# Patient Record
Sex: Female | Born: 1945 | Race: White | Hispanic: No | Marital: Married | Smoking: Never smoker
Health system: Southern US, Community
[De-identification: ages and names within clinical notes are randomized; demographics above are authoritative.]

## PROBLEM LIST (undated history)

## (undated) DIAGNOSIS — I10 Essential (primary) hypertension: Secondary | ICD-10-CM

## (undated) HISTORY — PX: BLADDER SURGERY: SHX569

## (undated) HISTORY — PX: CHOLECYSTECTOMY: SHX55

## (undated) HISTORY — PX: ABDOMINAL HYSTERECTOMY: SHX81

---

## 2014-03-07 ENCOUNTER — Encounter (HOSPITAL_COMMUNITY): Payer: Self-pay | Admitting: Emergency Medicine

## 2014-03-07 ENCOUNTER — Inpatient Hospital Stay (HOSPITAL_COMMUNITY)
Admission: EM | Admit: 2014-03-07 | Discharge: 2014-03-11 | DRG: 482 | Disposition: A | Discharge status: Home Health | Payer: Medicare Other | Attending: Internal Medicine | Admitting: Internal Medicine

## 2014-03-07 DIAGNOSIS — S72001A Fracture of unspecified part of neck of right femur, initial encounter for closed fracture: Secondary | ICD-10-CM

## 2014-03-07 DIAGNOSIS — Z7982 Long term (current) use of aspirin: Secondary | ICD-10-CM

## 2014-03-07 DIAGNOSIS — W108XXA Fall (on) (from) other stairs and steps, initial encounter: Secondary | ICD-10-CM | POA: Diagnosis present

## 2014-03-07 DIAGNOSIS — M810 Age-related osteoporosis without current pathological fracture: Secondary | ICD-10-CM | POA: Diagnosis present

## 2014-03-07 DIAGNOSIS — Z79899 Other long term (current) drug therapy: Secondary | ICD-10-CM

## 2014-03-07 DIAGNOSIS — E876 Hypokalemia: Secondary | ICD-10-CM

## 2014-03-07 DIAGNOSIS — I10 Essential (primary) hypertension: Secondary | ICD-10-CM

## 2014-03-07 DIAGNOSIS — S72033A Displaced midcervical fracture of unspecified femur, initial encounter for closed fracture: Principal | ICD-10-CM | POA: Diagnosis present

## 2014-03-07 DIAGNOSIS — S01111A Laceration without foreign body of right eyelid and periocular area, initial encounter: Secondary | ICD-10-CM

## 2014-03-07 DIAGNOSIS — S0180XA Unspecified open wound of other part of head, initial encounter: Secondary | ICD-10-CM | POA: Diagnosis present

## 2014-03-07 HISTORY — DX: Essential (primary) hypertension: I10

## 2014-03-07 LAB — CBC WITH DIFFERENTIAL/PLATELET
Basophils Absolute: 0 10*3/uL (ref 0.0–0.1)
Basophils Relative: 0 % (ref 0–1)
EOS ABS: 0.2 10*3/uL (ref 0.0–0.7)
Eosinophils Relative: 1 % (ref 0–5)
HCT: 33.5 % — ABNORMAL LOW (ref 36.0–46.0)
Hemoglobin: 10.9 g/dL — ABNORMAL LOW (ref 12.0–15.0)
LYMPHS ABS: 2.3 10*3/uL (ref 0.7–4.0)
LYMPHS PCT: 19 % (ref 12–46)
MCH: 26.8 pg (ref 26.0–34.0)
MCHC: 32.5 g/dL (ref 30.0–36.0)
MCV: 82.3 fL (ref 78.0–100.0)
Monocytes Absolute: 0.8 10*3/uL (ref 0.1–1.0)
Monocytes Relative: 6 % (ref 3–12)
NEUTROS ABS: 8.8 10*3/uL — AB (ref 1.7–7.7)
Neutrophils Relative %: 74 % (ref 43–77)
PLATELETS: 221 10*3/uL (ref 150–400)
RBC: 4.07 MIL/uL (ref 3.87–5.11)
RDW: 13.2 % (ref 11.5–15.5)
WBC: 12 10*3/uL — AB (ref 4.0–10.5)

## 2014-03-07 MED ORDER — FENTANYL CITRATE 0.05 MG/ML IJ SOLN
50.0000 ug | Freq: Once | INTRAMUSCULAR | Status: AC
  Administered 2014-03-07: 50 ug via INTRAVENOUS
  Filled 2014-03-07: qty 2

## 2014-03-07 MED ORDER — ONDANSETRON HCL 4 MG/2ML IJ SOLN
4.0000 mg | Freq: Once | INTRAMUSCULAR | Status: AC
  Administered 2014-03-07: 4 mg via INTRAVENOUS
  Filled 2014-03-07: qty 2

## 2014-03-07 NOTE — ED Provider Notes (Signed)
CSN: [id number]     Arrival date & time 03/07/14  2313 History   First MD Initiated Contact with Patient 03/07/14 2331     Chief Complaint  Patient presents with  . Fall     (Consider location/radiation/quality/duration/timing/severity/associated sxs/prior Treatment) HPI Patient states she was walking down wooden steps in socks and slipped. She fell down 3 or 4 steps. She landed on her right hip. She also struck her head but denies any loss of consciousness. She was unable to ambulate after the fall. She complains of scalp pain and right hip pain. Denies any other pain. EMS arrived and place patient on long board and cervical collar precautions. Patient denies any focal weakness or numbness. Past Medical History  Diagnosis Date  . Hypertension    Past Surgical History  Procedure Laterality Date  . Cholecystectomy    . Bladder surgery    . Abdominal hysterectomy     No family history on file. History  Substance Use Topics  . Smoking status: Never Smoker   . Smokeless tobacco: Not on file  . Alcohol Use: No   OB History   Grav Para Term Preterm Abortions TAB SAB Ect Mult Living                 Review of Systems  Constitutional: Negative for fever and chills.  Respiratory: Negative for shortness of breath.   Cardiovascular: Negative for chest pain.  Gastrointestinal: Negative for nausea, vomiting, abdominal pain and diarrhea.  Musculoskeletal: Positive for arthralgias. Negative for back pain, neck pain and neck stiffness.  Skin: Positive for wound.  Neurological: Positive for headaches. Negative for dizziness, syncope, weakness, light-headedness and numbness.  All other systems reviewed and are negative.     Allergies  Sulfa antibiotics  Home Medications   Prior to Admission medications   Not on File   BP 131/76  Pulse 69  Temp(Src) 97.9 F (36.6 C) (Oral)  Resp 22  SpO2 94% Physical Exam  Nursing note and vitals reviewed. Constitutional: She is oriented to  person, place, and time. She appears well-developed and well-nourished. No distress.  HENT:  Head: Normocephalic.  Mouth/Throat: Oropharynx is clear and moist.  2 cm laceration above the right eyebrow. No active bleeding.  Eyes: EOM are normal. Pupils are equal, round, and reactive to light.  Neck:  Cervical collar in place  Cardiovascular: Normal rate and regular rhythm.   Pulmonary/Chest: Effort normal and breath sounds normal. No respiratory distress. She has no wheezes. She has no rales. She exhibits no tenderness.  Abdominal: Soft. Bowel sounds are normal. She exhibits no distension and no mass. There is no tenderness. There is no rebound and no guarding.  Musculoskeletal: She exhibits no edema and no tenderness.  Decreased range of motion of the right hip due to pain. No tenderness to palpation of the right knee and right ankle. Dorsalis pedis pulses intact. Question external rotation of the right lower extremity.  Neurological: She is alert and oriented to person, place, and time.  No motor deficit noted. Examination of the right lower extremity is limited by pain. Sensation is fully intact.  Skin: Skin is warm and dry. No rash noted. No erythema.  Psychiatric: She has a normal mood and affect. Her behavior is normal.    ED Course  LACERATION REPAIR Date/Time: 03/08/2014 2:55 AM Performed by: Loren Racer Authorized by: Ranae Palms, Ledger Heindl Body area: head/neck Location details: right eyebrow Laceration length: 1 cm Foreign bodies: no foreign bodies Tendon involvement: none  Nerve involvement: none Irrigation solution: saline Amount of cleaning: standard Skin closure: glue Patient tolerance: Patient tolerated the procedure well with no immediate complications.   (including critical care time) Labs Review Labs Reviewed  CBC WITH DIFFERENTIAL - Abnormal; Notable for the following:    WBC 12.0 (*)    Hemoglobin 10.9 (*)    HCT 33.5 (*)    Neutro Abs 8.8 (*)    All other  components within normal limits  COMPREHENSIVE METABOLIC PANEL  PROTIME-INR  URINALYSIS, ROUTINE W REFLEX MICROSCOPIC    Imaging Review No results found.   EKG Interpretation None      MDM   Final diagnoses:  None     Discussed with Dr. Lanae BoastGarner who will admit the patient. Dr. Eulah PontMurphy is aware of the patient and will see her this morning.  Loren Raceravid Courvoisier Hamblen, MD 03/08/14 [phone]

## 2014-03-07 NOTE — ED Notes (Signed)
Pt arrives via EMS from home, fell in shower, tripped and fell and hit head. No LOC. Allergic to sulfa. Refuses pain meds d/t N/V.

## 2014-03-07 NOTE — ED Notes (Signed)
The patient's husband took a Ruby stone with a God ring off the right hand and Wedding set (1/2 carat diamond) off of the left hand. The tech has reported to the RN in charge.

## 2014-03-07 NOTE — ED Notes (Signed)
Right hip fx, externally rotated, good pedial pulses

## 2014-03-08 ENCOUNTER — Encounter (HOSPITAL_COMMUNITY): Payer: Medicare Other | Admitting: Anesthesiology

## 2014-03-08 ENCOUNTER — Emergency Department (HOSPITAL_COMMUNITY): Payer: Medicare Other

## 2014-03-08 ENCOUNTER — Inpatient Hospital Stay (HOSPITAL_COMMUNITY): Payer: Medicare Other

## 2014-03-08 ENCOUNTER — Encounter (HOSPITAL_COMMUNITY)
Admission: EM | Disposition: A | Discharge status: Home Health | Payer: Medicare Other | Source: Home / Self Care | Attending: Internal Medicine

## 2014-03-08 ENCOUNTER — Encounter (HOSPITAL_COMMUNITY): Payer: Self-pay | Admitting: Emergency Medicine

## 2014-03-08 ENCOUNTER — Encounter (HOSPITAL_COMMUNITY)
Admission: EM | Disposition: A | Discharge status: Home Health | Payer: Self-pay | Source: Home / Self Care | Attending: Internal Medicine

## 2014-03-08 ENCOUNTER — Inpatient Hospital Stay (HOSPITAL_COMMUNITY): Payer: Medicare Other | Admitting: Anesthesiology

## 2014-03-08 DIAGNOSIS — S72001A Fracture of unspecified part of neck of right femur, initial encounter for closed fracture: Secondary | ICD-10-CM | POA: Diagnosis present

## 2014-03-08 DIAGNOSIS — S0180XA Unspecified open wound of other part of head, initial encounter: Secondary | ICD-10-CM

## 2014-03-08 DIAGNOSIS — S72009A Fracture of unspecified part of neck of unspecified femur, initial encounter for closed fracture: Secondary | ICD-10-CM

## 2014-03-08 DIAGNOSIS — E876 Hypokalemia: Secondary | ICD-10-CM | POA: Diagnosis present

## 2014-03-08 DIAGNOSIS — I10 Essential (primary) hypertension: Secondary | ICD-10-CM

## 2014-03-08 HISTORY — PX: HIP PINNING,CANNULATED: SHX1758

## 2014-03-08 LAB — BASIC METABOLIC PANEL
BUN: 13 mg/dL (ref 6–23)
CO2: 26 mEq/L (ref 19–32)
CREATININE: 0.58 mg/dL (ref 0.50–1.10)
Calcium: 8.8 mg/dL (ref 8.4–10.5)
Chloride: 99 mEq/L (ref 96–112)
GFR calc non Af Amer: >90 mL/min (ref >90)
Glucose, Bld: 106 mg/dL — ABNORMAL HIGH (ref 70–99)
POTASSIUM: 3.2 meq/L — AB (ref 3.7–5.3)
Sodium: 139 mEq/L (ref 137–147)

## 2014-03-08 LAB — COMPREHENSIVE METABOLIC PANEL
ALT: 13 U/L (ref 0–35)
AST: 19 U/L (ref 0–37)
Albumin: 3.8 g/dL (ref 3.5–5.2)
Alkaline Phosphatase: 59 U/L (ref 39–117)
BUN: 18 mg/dL (ref 6–23)
CO2: 27 meq/L (ref 19–32)
Calcium: 9.2 mg/dL (ref 8.4–10.5)
Chloride: 86 mEq/L — ABNORMAL LOW (ref 96–112)
Creatinine, Ser: 0.72 mg/dL (ref 0.50–1.10)
GFR calc non Af Amer: 86 mL/min — ABNORMAL LOW (ref >90)
Glucose, Bld: 118 mg/dL — ABNORMAL HIGH (ref 70–99)
POTASSIUM: 2.8 meq/L — AB (ref 3.7–5.3)
Sodium: 127 mEq/L — ABNORMAL LOW (ref 137–147)
TOTAL PROTEIN: 7.3 g/dL (ref 6.0–8.3)
Total Bilirubin: 0.3 mg/dL (ref 0.3–1.2)

## 2014-03-08 LAB — URINALYSIS, ROUTINE W REFLEX MICROSCOPIC
Bilirubin Urine: NEGATIVE
GLUCOSE, UA: NEGATIVE mg/dL
Hgb urine dipstick: NEGATIVE
Ketones, ur: 15 mg/dL — AB
Leukocytes, UA: NEGATIVE
Nitrite: NEGATIVE
PROTEIN: NEGATIVE mg/dL
Specific Gravity, Urine: 1.022 (ref 1.005–1.030)
Urobilinogen, UA: 1 mg/dL (ref 0.0–1.0)
pH: 7 (ref 5.0–8.0)

## 2014-03-08 LAB — SURGICAL PCR SCREEN
MRSA, PCR: NEGATIVE
Staphylococcus aureus: NEGATIVE

## 2014-03-08 LAB — PROTIME-INR
INR: 1.04 (ref 0.00–1.49)
Prothrombin Time: 13.4 seconds (ref 11.6–15.2)

## 2014-03-08 SURGERY — FIXATION, FEMUR, NECK, PERCUTANEOUS, USING SCREW
Anesthesia: General | Site: Hip | Laterality: Right

## 2014-03-08 SURGERY — PINNING, EXTREMITY, PERCUTANEOUS
Anesthesia: General | Laterality: Right

## 2014-03-08 MED ORDER — OXYCODONE HCL 5 MG/5ML PO SOLN: 5.0000 mg | Freq: Once | ORAL | Status: AC | PRN

## 2014-03-08 MED ORDER — FENTANYL CITRATE 0.05 MG/ML IJ SOLN
INTRAMUSCULAR | Status: DC | PRN
  Administered 2014-03-08: 50 ug via INTRAVENOUS

## 2014-03-08 MED ORDER — ONDANSETRON HCL 4 MG/2ML IJ SOLN: 4.0000 mg | Freq: Four times a day (QID) | INTRAMUSCULAR | Status: DC | PRN

## 2014-03-08 MED ORDER — LIDOCAINE HCL (CARDIAC) 20 MG/ML IV SOLN
INTRAVENOUS | Status: DC | PRN
  Administered 2014-03-08: 100 mg via INTRAVENOUS

## 2014-03-08 MED ORDER — CEFAZOLIN SODIUM-DEXTROSE 2-3 GM-% IV SOLR
INTRAVENOUS | Status: DC | PRN
  Administered 2014-03-08: 2 g via INTRAVENOUS

## 2014-03-08 MED ORDER — GLYCOPYRROLATE 0.2 MG/ML IJ SOLN
INTRAMUSCULAR | Status: DC | PRN
  Administered 2014-03-08: 0.4 mg via INTRAVENOUS

## 2014-03-08 MED ORDER — DOCUSATE SODIUM 100 MG PO CAPS: 100.0000 mg | ORAL_CAPSULE | Freq: Two times a day (BID) | ORAL | Status: AC

## 2014-03-08 MED ORDER — LIDOCAINE HCL (CARDIAC) 20 MG/ML IV SOLN
INTRAVENOUS | Status: AC
  Filled 2014-03-08: qty 5

## 2014-03-08 MED ORDER — ROCURONIUM BROMIDE 50 MG/5ML IV SOLN
INTRAVENOUS | Status: AC
  Filled 2014-03-08: qty 1

## 2014-03-08 MED ORDER — NEOSTIGMINE METHYLSULFATE 10 MG/10ML IV SOLN
INTRAVENOUS | Status: DC | PRN
  Administered 2014-03-08: 3 mg via INTRAVENOUS

## 2014-03-08 MED ORDER — ASPIRIN EC 325 MG PO TBEC
325.0000 mg | DELAYED_RELEASE_TABLET | Freq: Every day | ORAL | Status: DC
  Administered 2014-03-09 – 2014-03-11 (×3): 325 mg via ORAL
  Filled 2014-03-08 (×4): qty 1

## 2014-03-08 MED ORDER — FENTANYL CITRATE 0.05 MG/ML IJ SOLN
50.0000 ug | Freq: Once | INTRAMUSCULAR | Status: AC
  Administered 2014-03-08: 50 ug via INTRAVENOUS
  Filled 2014-03-08: qty 2

## 2014-03-08 MED ORDER — EPHEDRINE SULFATE 50 MG/ML IJ SOLN
INTRAMUSCULAR | Status: DC | PRN
  Administered 2014-03-08: 15 mg via INTRAVENOUS
  Administered 2014-03-08: 10 mg via INTRAVENOUS

## 2014-03-08 MED ORDER — HYDROMORPHONE HCL PF 1 MG/ML IJ SOLN
0.2500 mg | INTRAMUSCULAR | Status: DC | PRN
  Administered 2014-03-08 (×4): 0.5 mg via INTRAVENOUS

## 2014-03-08 MED ORDER — BUPIVACAINE HCL (PF) 0.25 % IJ SOLN
INTRAMUSCULAR | Status: AC
  Filled 2014-03-08: qty 30

## 2014-03-08 MED ORDER — ONDANSETRON HCL 4 MG PO TABS: 4.0000 mg | ORAL_TABLET | Freq: Four times a day (QID) | ORAL | Status: DC | PRN

## 2014-03-08 MED ORDER — PROPOFOL 10 MG/ML IV BOLUS
INTRAVENOUS | Status: AC
  Filled 2014-03-08: qty 20

## 2014-03-08 MED ORDER — ASPIRIN EC 325 MG PO TBEC: 325.0000 mg | DELAYED_RELEASE_TABLET | Freq: Every day | ORAL | Status: AC

## 2014-03-08 MED ORDER — PHENOL 1.4 % MT LIQD
1.0000 | OROMUCOSAL | Status: DC | PRN
  Filled 2014-03-08: qty 177

## 2014-03-08 MED ORDER — CEFAZOLIN SODIUM-DEXTROSE 2-3 GM-% IV SOLR
2.0000 g | Freq: Four times a day (QID) | INTRAVENOUS | Status: AC
  Administered 2014-03-09 (×2): 2 g via INTRAVENOUS
  Filled 2014-03-08 (×2): qty 50

## 2014-03-08 MED ORDER — ACETAMINOPHEN 325 MG PO TABS: 650.0000 mg | ORAL_TABLET | Freq: Four times a day (QID) | ORAL | Status: DC | PRN

## 2014-03-08 MED ORDER — ONDANSETRON HCL 4 MG PO TABS: 4.0000 mg | ORAL_TABLET | Freq: Three times a day (TID) | ORAL | Status: AC | PRN

## 2014-03-08 MED ORDER — LACTATED RINGERS IV SOLN
INTRAVENOUS | Status: DC
  Administered 2014-03-08: 17:00:00 via INTRAVENOUS

## 2014-03-08 MED ORDER — MENTHOL 3 MG MT LOZG
1.0000 | LOZENGE | OROMUCOSAL | Status: DC | PRN
  Filled 2014-03-08: qty 9

## 2014-03-08 MED ORDER — MORPHINE SULFATE 2 MG/ML IJ SOLN
0.5000 mg | INTRAMUSCULAR | Status: DC | PRN
  Administered 2014-03-08 (×3): 0.5 mg via INTRAVENOUS
  Filled 2014-03-08 (×3): qty 1

## 2014-03-08 MED ORDER — POTASSIUM CHLORIDE 10 MEQ/100ML IV SOLN
10.0000 meq | INTRAVENOUS | Status: AC
  Administered 2014-03-08 (×2): 10 meq via INTRAVENOUS
  Filled 2014-03-08 (×2): qty 100

## 2014-03-08 MED ORDER — HYDROCODONE-ACETAMINOPHEN 5-325 MG PO TABS: 1.0000 | ORAL_TABLET | ORAL | Status: AC | PRN

## 2014-03-08 MED ORDER — ROCURONIUM BROMIDE 100 MG/10ML IV SOLN
INTRAVENOUS | Status: DC | PRN
  Administered 2014-03-08: 40 mg via INTRAVENOUS

## 2014-03-08 MED ORDER — HYDROMORPHONE HCL PF 1 MG/ML IJ SOLN
INTRAMUSCULAR | Status: AC
  Filled 2014-03-08: qty 1

## 2014-03-08 MED ORDER — LACTATED RINGERS IV SOLN
INTRAVENOUS | Status: DC | PRN
  Administered 2014-03-08: 18:00:00 via INTRAVENOUS

## 2014-03-08 MED ORDER — MIDAZOLAM HCL 5 MG/5ML IJ SOLN
INTRAMUSCULAR | Status: DC | PRN
  Administered 2014-03-08: 2 mg via INTRAVENOUS

## 2014-03-08 MED ORDER — FENTANYL CITRATE 0.05 MG/ML IJ SOLN
INTRAMUSCULAR | Status: AC
  Filled 2014-03-08: qty 5

## 2014-03-08 MED ORDER — PROPRANOLOL HCL ER 80 MG PO CP24
80.0000 mg | ORAL_CAPSULE | Freq: Every day | ORAL | Status: DC
  Administered 2014-03-09 – 2014-03-11 (×3): 80 mg via ORAL
  Filled 2014-03-08 (×4): qty 1

## 2014-03-08 MED ORDER — ASPIRIN EC 325 MG PO TBEC
325.0000 mg | DELAYED_RELEASE_TABLET | Freq: Every day | ORAL | Status: DC
  Filled 2014-03-08: qty 1

## 2014-03-08 MED ORDER — NEOSTIGMINE METHYLSULFATE 10 MG/10ML IV SOLN
INTRAVENOUS | Status: AC
  Filled 2014-03-08: qty 1

## 2014-03-08 MED ORDER — ACETAMINOPHEN 650 MG RE SUPP: 650.0000 mg | Freq: Four times a day (QID) | RECTAL | Status: DC | PRN

## 2014-03-08 MED ORDER — OXYCODONE HCL 5 MG PO TABS
ORAL_TABLET | ORAL | Status: AC
  Filled 2014-03-08: qty 1

## 2014-03-08 MED ORDER — ONDANSETRON HCL 4 MG/2ML IJ SOLN
INTRAMUSCULAR | Status: DC | PRN
  Administered 2014-03-08: 4 mg via INTRAVENOUS

## 2014-03-08 MED ORDER — BUPIVACAINE HCL (PF) 0.25 % IJ SOLN
INTRAMUSCULAR | Status: DC | PRN
  Administered 2014-03-08: 10 mL

## 2014-03-08 MED ORDER — GLYCOPYRROLATE 0.2 MG/ML IJ SOLN
INTRAMUSCULAR | Status: AC
  Filled 2014-03-08: qty 2

## 2014-03-08 MED ORDER — PROPOFOL 10 MG/ML IV BOLUS
INTRAVENOUS | Status: DC | PRN
  Administered 2014-03-08: 150 mg via INTRAVENOUS

## 2014-03-08 MED ORDER — MIDAZOLAM HCL 2 MG/2ML IJ SOLN
INTRAMUSCULAR | Status: AC
  Filled 2014-03-08: qty 2

## 2014-03-08 MED ORDER — ONDANSETRON HCL 4 MG/2ML IJ SOLN
4.0000 mg | Freq: Once | INTRAMUSCULAR | Status: DC | PRN
Start: 2014-03-08 — End: 2014-03-08

## 2014-03-08 MED ORDER — METOCLOPRAMIDE HCL 5 MG/ML IJ SOLN: 5.0000 mg | Freq: Three times a day (TID) | INTRAMUSCULAR | Status: DC | PRN

## 2014-03-08 MED ORDER — ONDANSETRON HCL 4 MG/2ML IJ SOLN
4.0000 mg | Freq: Four times a day (QID) | INTRAMUSCULAR | Status: DC | PRN
  Administered 2014-03-08: 4 mg via INTRAVENOUS
  Filled 2014-03-08: qty 2

## 2014-03-08 MED ORDER — OXYCODONE HCL 5 MG PO TABS
5.0000 mg | ORAL_TABLET | Freq: Once | ORAL | Status: AC | PRN
  Administered 2014-03-08: 5 mg via ORAL

## 2014-03-08 MED ORDER — METOCLOPRAMIDE HCL 5 MG PO TABS: 5.0000 mg | ORAL_TABLET | Freq: Three times a day (TID) | ORAL | Status: DC | PRN

## 2014-03-08 MED ORDER — PHENYLEPHRINE HCL 10 MG/ML IJ SOLN
INTRAMUSCULAR | Status: DC | PRN
  Administered 2014-03-08: 80 ug via INTRAVENOUS

## 2014-03-08 MED ORDER — SODIUM CHLORIDE 0.9 % IV SOLN
Freq: Once | INTRAVENOUS | Status: AC
  Administered 2014-03-08: 01:00:00 via INTRAVENOUS

## 2014-03-08 MED ORDER — HYDROCODONE-ACETAMINOPHEN 5-325 MG PO TABS
1.0000 | ORAL_TABLET | Freq: Four times a day (QID) | ORAL | Status: DC | PRN
  Administered 2014-03-08 – 2014-03-10 (×5): 2 via ORAL
  Administered 2014-03-10: 1 via ORAL
  Administered 2014-03-10 – 2014-03-11 (×3): 2 via ORAL
  Filled 2014-03-08 (×9): qty 2

## 2014-03-08 SURGICAL SUPPLY — 30 items
COVER PERINEAL POST (MISCELLANEOUS) ×3 IMPLANT
COVER SURGICAL LIGHT HANDLE (MISCELLANEOUS) ×3 IMPLANT
DRAPE IMP U-DRAPE 54X76 (DRAPES) ×3 IMPLANT
DRAPE STERI IOBAN 125X83 (DRAPES) ×3 IMPLANT
DRSG EMULSION OIL 3X3 NADH (GAUZE/BANDAGES/DRESSINGS) ×3 IMPLANT
DRSG TEGADERM 4X4.75 (GAUZE/BANDAGES/DRESSINGS) ×3 IMPLANT
DURAPREP 26ML APPLICATOR (WOUND CARE) ×3 IMPLANT
ELECT REM PT RETURN 9FT ADLT (ELECTROSURGICAL) ×3
ELECTRODE REM PT RTRN 9FT ADLT (ELECTROSURGICAL) ×1 IMPLANT
GLOVE BIO SURGEON STRL SZ7.5 (GLOVE) ×6 IMPLANT
GLOVE BIOGEL PI IND STRL 8 (GLOVE) ×1 IMPLANT
GLOVE BIOGEL PI INDICATOR 8 (GLOVE) ×2
GOWN STRL REUS W/ TWL LRG LVL3 (GOWN DISPOSABLE) ×1 IMPLANT
GOWN STRL REUS W/TWL LRG LVL3 (GOWN DISPOSABLE) ×2
KIT BASIN OR (CUSTOM PROCEDURE TRAY) ×3 IMPLANT
KIT ROOM TURNOVER OR (KITS) ×3 IMPLANT
LINER BOOT UNIVERSAL DISP (MISCELLANEOUS) ×3 IMPLANT
MANIFOLD NEPTUNE II (INSTRUMENTS) ×3 IMPLANT
NS IRRIG 1000ML POUR BTL (IV SOLUTION) ×3 IMPLANT
PACK GENERAL/GYN (CUSTOM PROCEDURE TRAY) ×3 IMPLANT
PAD ARMBOARD 7.5X6 YLW CONV (MISCELLANEOUS) ×6 IMPLANT
SPONGE GAUZE 4X4 12PLY (GAUZE/BANDAGES/DRESSINGS) ×3 IMPLANT
STAPLER VISISTAT 35W (STAPLE) ×3 IMPLANT
SUT MON AB 2-0 CT1 36 (SUTURE) ×3 IMPLANT
SUT VIC AB 0 CT1 27 (SUTURE) ×2
SUT VIC AB 0 CT1 27XBRD ANBCTR (SUTURE) ×1 IMPLANT
TOWEL OR 17X24 6PK STRL BLUE (TOWEL DISPOSABLE) ×3 IMPLANT
TOWEL OR 17X26 10 PK STRL BLUE (TOWEL DISPOSABLE) ×3 IMPLANT
TOWEL OR NON WOVEN STRL DISP B (DISPOSABLE) ×3 IMPLANT
WATER STERILE IRR 1000ML POUR (IV SOLUTION) ×3 IMPLANT

## 2014-03-08 SURGICAL SUPPLY — 37 items
BIT DRILL 4.9 CANNULATED (BIT) ×1
BIT DRILL CANN QC 4.9 LRG (BIT) ×1 IMPLANT
COVER PERINEAL POST (MISCELLANEOUS) ×2 IMPLANT
COVER SURGICAL LIGHT HANDLE (MISCELLANEOUS) ×2 IMPLANT
DRAPE IMP U-DRAPE 54X76 (DRAPES) ×2 IMPLANT
DRAPE STERI IOBAN 125X83 (DRAPES) ×2 IMPLANT
DRILL BIT CANNULATED 4.9 (BIT) ×1
DRSG EMULSION OIL 3X3 NADH (GAUZE/BANDAGES/DRESSINGS) ×2 IMPLANT
DRSG MEPILEX BORDER 4X4 (GAUZE/BANDAGES/DRESSINGS) ×2 IMPLANT
DRSG TEGADERM 4X4.75 (GAUZE/BANDAGES/DRESSINGS) ×2 IMPLANT
DURAPREP 26ML APPLICATOR (WOUND CARE) ×2 IMPLANT
ELECT REM PT RETURN 9FT ADLT (ELECTROSURGICAL) ×2
ELECTRODE REM PT RTRN 9FT ADLT (ELECTROSURGICAL) ×1 IMPLANT
GLOVE BIO SURGEON STRL SZ7.5 (GLOVE) ×4 IMPLANT
GLOVE BIOGEL PI IND STRL 8 (GLOVE) ×1 IMPLANT
GLOVE BIOGEL PI INDICATOR 8 (GLOVE) ×1
GOWN STRL REUS W/ TWL LRG LVL3 (GOWN DISPOSABLE) ×1 IMPLANT
GOWN STRL REUS W/TWL LRG LVL3 (GOWN DISPOSABLE) ×1
GUIDEWIRE THRD ASNIS 3.2X300 (WIRE) ×8 IMPLANT
KIT BASIN OR (CUSTOM PROCEDURE TRAY) ×2 IMPLANT
KIT ROOM TURNOVER OR (KITS) ×2 IMPLANT
LINER BOOT UNIVERSAL DISP (MISCELLANEOUS) ×2 IMPLANT
MANIFOLD NEPTUNE II (INSTRUMENTS) ×2 IMPLANT
NS IRRIG 1000ML POUR BTL (IV SOLUTION) ×2 IMPLANT
PACK GENERAL/GYN (CUSTOM PROCEDURE TRAY) ×2 IMPLANT
PAD ARMBOARD 7.5X6 YLW CONV (MISCELLANEOUS) ×4 IMPLANT
SCREW ASNIS 85MM (Screw) ×6 IMPLANT
SPONGE GAUZE 4X4 12PLY (GAUZE/BANDAGES/DRESSINGS) ×2 IMPLANT
STAPLER VISISTAT 35W (STAPLE) ×2 IMPLANT
STRIP CLOSURE SKIN 1/2X4 (GAUZE/BANDAGES/DRESSINGS) ×2 IMPLANT
SUT MON AB 2-0 CT1 36 (SUTURE) ×2 IMPLANT
SUT VIC AB 0 CT1 27 (SUTURE)
SUT VIC AB 0 CT1 27XBRD ANBCTR (SUTURE) IMPLANT
TOWEL OR 17X24 6PK STRL BLUE (TOWEL DISPOSABLE) ×2 IMPLANT
TOWEL OR 17X26 10 PK STRL BLUE (TOWEL DISPOSABLE) ×2 IMPLANT
TOWEL OR NON WOVEN STRL DISP B (DISPOSABLE) ×2 IMPLANT
WATER STERILE IRR 1000ML POUR (IV SOLUTION) ×2 IMPLANT

## 2014-03-08 NOTE — ED Notes (Signed)
Dr Ranae PalmsYelverton removed C-collar

## 2014-03-08 NOTE — Progress Notes (Signed)
Utilization review completed. Chanteria Haggard, RN, BSN. 

## 2014-03-08 NOTE — Anesthesia Postprocedure Evaluation (Signed)
  Anesthesia Post-op Note  Patient: [name] [name]  Procedure(s) Performed: Procedure(s): CANNULATED HIP PINNING (Right)  Patient Location: PACU  Anesthesia Type:General  Level of Consciousness: awake, alert  and oriented  Airway and Oxygen Therapy: Patient Spontanous Breathing  Post-op Pain: mild  Post-op Assessment: Post-op Vital signs reviewed  Post-op Vital Signs: Reviewed  Last Vitals:  Filed Vitals:   03/08/14 2000  BP:   Pulse: 65  Temp:   Resp: 20    Complications: No apparent anesthesia complications

## 2014-03-08 NOTE — Discharge Instructions (Signed)
Bear weight as tolerated ° °Take ASA 325 daily for 30 days °

## 2014-03-08 NOTE — ED Notes (Signed)
The tech and the RN in charge did peri care; before the in/out catheter urine specimen was collected.

## 2014-03-08 NOTE — Progress Notes (Signed)
Orthopedic Tech Progress Note Patient Details:  [name] MuscaLinda A [name] Mar 12, 1946 [id number] Visitd patient in room and discussed use of OHF. Patient also has shoulder injury and didn't think she would be able to pul up on frame. Informed patient that if she felt differently after her hip surgery she could let the nursing staff know and Ortho would apply frame.  Patient ID: [name] [name], female   DOB: Mar 12, 1946, 68 y.o.   MRN: [id number]   [name] [name] 03/08/2014, 2:40 PM

## 2014-03-08 NOTE — Anesthesia Preprocedure Evaluation (Addendum)
Anesthesia Evaluation  Patient identified by MRN, date of birth, ID band Patient awake    Reviewed: Allergy & Precautions, H&P , NPO status , Patient's Chart, lab work & pertinent test results, reviewed documented beta blocker date and time   History of Anesthesia Complications (+) PONVNegative for: history of anesthetic complications  Airway Mallampati: II TM Distance: >3 FB Neck ROM: Full    Dental  (+) Teeth Intact, Dental Advisory Given   Pulmonary neg pulmonary ROS,  breath sounds clear to auscultation        Cardiovascular hypertension, Pt. on medications and Pt. on home beta blockers + Valvular Problems/Murmurs Rhythm:Regular Rate:Normal     Neuro/Psych negative psych ROS   GI/Hepatic negative GI ROS, Neg liver ROS,   Endo/Other  negative endocrine ROS  Renal/GU negative Renal ROS     Musculoskeletal   Abdominal   Peds  Hematology negative hematology ROS (+)   Anesthesia Other Findings   Reproductive/Obstetrics negative OB ROS                         Anesthesia Physical Anesthesia Plan  ASA: II  Anesthesia Plan: General   Post-op Pain Management:    Induction: Intravenous  Airway Management Planned: Oral ETT  Additional Equipment:   Intra-op Plan:   Post-operative Plan: Extubation in OR  Informed Consent: I have reviewed the patients History and Physical, chart, labs and discussed the procedure including the risks, benefits and alternatives for the proposed anesthesia with the patient or authorized representative who has indicated his/her understanding and acceptance.   Dental advisory given  Plan Discussed with: CRNA, Anesthesiologist and Surgeon  Anesthesia Plan Comments:         Anesthesia Quick Evaluation

## 2014-03-08 NOTE — Progress Notes (Signed)
[name] [name] PROGRESS NOTE  [name] MuscaLinda A [name] [id number] DOB: Aug 16, 1946 DOA: 03/07/2014 PCP: No PCP Per Patient I have seen and examined pt who is a yo admitted this am by Dr Julian ReilGardner with history of hypertension presenting status post fall with right hip fracture. EKG done earlier this morning with increased artifact(to my reading not consistent with a flutter) and>> repeat EKG with sinus bradycardia at 55, she is chest pain-free and denies shortness of breath. Discussed with Dr. Eulah PontMurphy and surgery planned this p.m. we'll continue current management plan as per Dr. Julian ReilGardner follow.     [name] MillinVIYUOH,[name] [name]  [name] [name] Pager [phone]. If 7PM-7AM, please contact night-coverage at [email or web address], password [facility] 03/08/2014, 2:28 PM  LOS: 1 day

## 2014-03-08 NOTE — ED Notes (Signed)
Patient oxygen saturation dropped to 88% patient placed on 2L via [place] sats now 98%

## 2014-03-08 NOTE — Progress Notes (Signed)
INITIAL NUTRITION ASSESSMENT  DOCUMENTATION CODES Per approved criteria  -Not Applicable   INTERVENTION: - Encourage adequate po intake with a variety of fruits and vegetables and high protein foods. - RD will continue to follow for nutrition care plan.  NUTRITION DIAGNOSIS: Increased nutrient needs related to hip fracture as evidenced by surgical repair.  Goal: Pt to meet >/= 90% of their estimated nutrition needs   Monitor:  Weight, po intake, labs, I/O's  Reason for Assessment: Consult- Hip Fracture Protocol  68 y.o. female  Admitting Dx: Closed right hip fracture  ASSESSMENT: 68 y.o. female who presents to the ED after a fall down 3 or 4 steps. She landed on her R hip, also struck head but no LOC.  - Pt with closed right hip fracture. - Probable surgical repair tomorrow 6/13. - Pt reports that she has had no recent weight changes or changes in appetite. - She denied the need for any nutritional supplements at this time. - Pt encouraged to continue optimal PO intake and to include high-protein foods while healing from surgery to promote healing.   - No signs of fat and muscle loss or wasting.  Height: Ht Readings from Last 1 Encounters:  03/08/14 5\' 8"  (1.727 m)    Weight: Wt Readings from Last 1 Encounters:  03/08/14 152 lb 1.9 oz (69 kg)    Ideal Body Weight: 63.9 kg  % Ideal Body Weight: 108%  Wt Readings from Last 10 Encounters:  03/08/14 152 lb 1.9 oz (69 kg)  03/08/14 152 lb 1.9 oz (69 kg)  03/08/14 152 lb 1.9 oz (69 kg)    Usual Body Weight: 142 lbs per pt report  % Usual Body Weight: 107%  BMI:  Body mass index is 23.13 kg/(m^2).  Estimated Nutritional Needs: Kcal: 1850-2050 Protein: 85-95 g Fluid: 1.9-2.1 L/day  Skin: WNL  Diet Order: NPO  EDUCATION NEEDS: -Education needs addressed   Intake/Output Summary (Last 24 hours) at 03/08/14 1306 Last data filed at 03/08/14 0925  Gross per 24 hour  Intake    100 ml  Output    120 ml   Net    -20 ml    Last BM: none recorded   Labs:   Recent Labs Lab 03/07/14 2330 03/08/14 0642  NA 127* 139  K 2.8* 3.2*  CL 86* 99  CO2 27 26  BUN 18 13  CREATININE 0.72 0.58  CALCIUM 9.2 8.8  GLUCOSE 118* 106*    CBG (last 3)  No results found for this basename: GLUCAP,  in the last 72 hours  Scheduled Meds: . aspirin EC  325 mg Oral Daily  . propranolol ER  80 mg Oral Daily    Continuous Infusions:   Past Medical History  Diagnosis Date  . Hypertension     Past Surgical History  Procedure Laterality Date  . Cholecystectomy    . Bladder surgery    . Abdominal hysterectomy      Ebbie LatusHaley Hawkins RD, LDN

## 2014-03-08 NOTE — H&P (Addendum)
[facility] History and Physical  [name] [name] ZOX:[id number] DOB: [birth year] DOA: 03/07/2014  Referring physician: EDP PCP: No PCP Per Patient   Chief Complaint: Fall, R hip pain   HPI: [name] [name] is a 68 y.o. female who presents to the ED after a fall down 3 or 4 steps.  She landed on her R hip, also struck head but no LOC.  She had R hip pain after fall, worse with movement, better when lying still.  Unable to ambulate.  Review of Systems: Systems reviewed.  As above, otherwise negative  Past Medical History  Diagnosis Date  . Hypertension    Past Surgical History  Procedure Laterality Date  . Cholecystectomy    . Bladder surgery    . Abdominal hysterectomy     Social History:  reports that she has never smoked. She does not have any smokeless tobacco history on file. She reports that she does not drink alcohol or use illicit drugs.  Allergies  Allergen Reactions  . Sulfa Antibiotics Nausea And Vomiting    No family history on file.   Prior to Admission medications   Medication Sig Start Date End Date Taking? Authorizing Provider  Cholecalciferol (VITAMIN D PO) Take 800 mg by mouth daily.   Yes Historical Provider, MD  hydrochlorothiazide (HYDRODIURIL) 25 MG tablet Take 25 mg by mouth daily.   Yes Historical Provider, MD  propranolol ER (INDERAL LA) 80 MG 24 hr capsule Take 80 mg by mouth daily.   Yes Historical Provider, MD   Physical Exam: Filed Vitals:   03/08/14 0230  BP: 111/47  Pulse: 75  Temp:   Resp: 16    BP 111/47  Pulse 75  Temp(Src) 97.9 F (36.6 C) (Oral)  Resp 16  SpO2 96%  General Appearance:    Alert, oriented, no distress, appears stated age  Head:    Normocephalic, atraumatic  Eyes:    PERRL, EOMI, sclera non-icteric        Nose:   Nares without drainage or epistaxis. Mucosa, turbinates normal  Throat:   Moist mucous membranes. Oropharynx without erythema or exudate.  Neck:   Supple. No carotid bruits.  No thyromegaly.  No  lymphadenopathy.   Back:     No CVA tenderness, no spinal tenderness  Lungs:     Clear to auscultation bilaterally, without wheezes, rhonchi or rales  Chest wall:    No tenderness to palpitation  Heart:    Regular rate and rhythm without murmurs, gallops, rubs  Abdomen:     Soft, non-tender, nondistended, normal bowel sounds, no organomegaly  Genitalia:    deferred  Rectal:    deferred  Extremities:   R hip tenderness, pain with movement.  Pulses:   2+ and symmetric all extremities  Skin:   Skin color, texture, turgor normal, no rashes or lesions  Lymph nodes:   Cervical, supraclavicular, and axillary nodes normal  Neurologic:   CNII-XII intact. Normal strength, sensation and reflexes      throughout    Labs on Admission:  Basic Metabolic Panel:  Recent Labs Lab 03/07/14 2330  NA 127*  K 2.8*  CL 86*  CO2 27  GLUCOSE 118*  BUN 18  CREATININE 0.72  CALCIUM 9.2   Liver Function Tests:  Recent Labs Lab 03/07/14 2330  AST 19  ALT 13  ALKPHOS 59  BILITOT 0.3  PROT 7.3  ALBUMIN 3.8   No results found for this basename: LIPASE, AMYLASE,  in the last 168  hours No results found for this basename: AMMONIA,  in the last 168 hours CBC:  Recent Labs Lab 03/07/14 2330  WBC 12.0*  NEUTROABS 8.8*  HGB 10.9*  HCT 33.5*  MCV 82.3  PLT 221   Cardiac Enzymes: No results found for this basename: CKTOTAL, CKMB, CKMBINDEX, TROPONINI,  in the last 168 hours  BNP (last 3 results) No results found for this basename: PROBNP,  in the last 8760 hours CBG: No results found for this basename: GLUCAP,  in the last 168 hours  Radiological Exams on Admission: Dg Hip Complete Right  03/08/2014   CLINICAL DATA:  Fall, pain  EXAM: RIGHT HIP - COMPLETE 2+ VIEW  COMPARISON:  None.  FINDINGS: There is no evidence of hip fracture or dislocation. There is no evidence of arthropathy or other focal bone abnormality. Mild bilateral hip DJD.  IMPRESSION: Negative.   Electronically Signed   By:  Davonna BellingJohn  Curnes M.D.   On: 03/08/2014 00:43   Ct Head Wo Contrast  03/08/2014   CLINICAL DATA:  Fall  EXAM: CT HEAD WITHOUT CONTRAST  CT CERVICAL SPINE WITHOUT CONTRAST  TECHNIQUE: Multidetector CT imaging of the head and cervical spine was performed following the standard protocol without intravenous contrast. Multiplanar CT image reconstructions of the cervical spine were also generated.  COMPARISON:  None available.  FINDINGS: CT HEAD FINDINGS  Mild chronic microvascular ischemic changes noted within the supratentorial white matter. There is no acute intracranial hemorrhage or infarct. No mass lesion or midline shift. Gray-white matter differentiation is well maintained. Ventricles are normal in size without evidence of hydrocephalus. CSF containing spaces are within normal limits. No extra-axial fluid collection.  The calvarium is intact.  Orbital soft tissues are within normal limits.  The paranasal sinuses and mastoid air cells are well pneumatized and free of fluid.  Small right periorbital contusion with associated soft tissue emphysema is present.  CT CERVICAL SPINE FINDINGS  The vertebral bodies are normally aligned with preservation of the normal cervical lordosis. Vertebral body heights are preserved. Normal C1-2 articulations are intact. No prevertebral soft tissue swelling. No acute fracture or listhesis.  Moderate multilevel degenerative disc disease is evidenced by a intervertebral disc space narrowing and endplate osteophytosis is present, most severe at C3-4. Scattered facet arthropathy seen throughout the cervical spine.  Visualized soft tissues of the neck are within normal limits. Visualized lung apices are clear without evidence of apical pneumothorax. Irregular biapical pleural thickening noted.  IMPRESSION: CT BRAIN:  1. No acute intracranial abnormality. 2. Small right periorbital contusion/laceration. CT CERVICAL SPINE:  No acute traumatic injury within the cervical spine.   Electronically  Signed   By: Rise MuBenjamin  McClintock M.D.   On: 03/08/2014 00:44   Ct Cervical Spine Wo Contrast  03/08/2014   CLINICAL DATA:  Fall  EXAM: CT HEAD WITHOUT CONTRAST  CT CERVICAL SPINE WITHOUT CONTRAST  TECHNIQUE: Multidetector CT imaging of the head and cervical spine was performed following the standard protocol without intravenous contrast. Multiplanar CT image reconstructions of the cervical spine were also generated.  COMPARISON:  None available.  FINDINGS: CT HEAD FINDINGS  Mild chronic microvascular ischemic changes noted within the supratentorial white matter. There is no acute intracranial hemorrhage or infarct. No mass lesion or midline shift. Gray-white matter differentiation is well maintained. Ventricles are normal in size without evidence of hydrocephalus. CSF containing spaces are within normal limits. No extra-axial fluid collection.  The calvarium is intact.  Orbital soft tissues are within normal limits.  The paranasal sinuses and mastoid air cells are well pneumatized and free of fluid.  Small right periorbital contusion with associated soft tissue emphysema is present.  CT CERVICAL SPINE FINDINGS  The vertebral bodies are normally aligned with preservation of the normal cervical lordosis. Vertebral body heights are preserved. Normal C1-2 articulations are intact. No prevertebral soft tissue swelling. No acute fracture or listhesis.  Moderate multilevel degenerative disc disease is evidenced by a intervertebral disc space narrowing and endplate osteophytosis is present, most severe at C3-4. Scattered facet arthropathy seen throughout the cervical spine.  Visualized soft tissues of the neck are within normal limits. Visualized lung apices are clear without evidence of apical pneumothorax. Irregular biapical pleural thickening noted.  IMPRESSION: CT BRAIN:  1. No acute intracranial abnormality. 2. Small right periorbital contusion/laceration. CT CERVICAL SPINE:  No acute traumatic injury within the  cervical spine.   Electronically Signed   By: Rise MuBenjamin  McClintock M.D.   On: 03/08/2014 00:44   Ct Hip Right Wo Contrast  03/08/2014   CLINICAL DATA:  Right hip pain after a fall.  EXAM: CT OF THE RIGHT HIP WITHOUT CONTRAST  TECHNIQUE: Multidetector CT imaging was performed according to the standard protocol. Multiplanar CT image reconstructions were also generated.  COMPARISON:  Right hip 03/08/2014  FINDINGS: Transverse impacted fracture of the subcapital right femoral neck without significant displacement. No evidence of dislocation of the hip joint. Visualized right hemipelvis appears intact. Minimal subcutaneous soft tissue hematoma over the right gluteal region. Visualized pelvic organs appear intact.  IMPRESSION: Transverse impacted fracture of the subcapital right femoral neck without significant displacement.   Electronically Signed   By: Burman NievesWilliam  Stevens M.D.   On: 03/08/2014 02:10    EKG: Independently reviewed.  Assessment/Plan Active Problems:   Closed right hip fracture   1. Closed R femoral neck fracture - non-displaced at this point, admitting under hip fracture protocol.  Bed rest, orthopaedic surgery being called by EDP.  Anticipate operative repair likely tomorrow.  Pain control per hip fracture protocol and patient NPO.  ASA and SCDs for DVT ppx. 2. HTN - continue propanolol, holding HCTZ for now. 3. Hyperkalemia - Replace potassium    Code Status: Full Code  Family Communication: Husband at bedside Disposition Plan: Admit to inpatient   Time spent: 50 min  GARDNER, JARED M. [facility] Pager [phone]  If 7AM-7PM, please contact the day team taking care of the patient [email or web address] Password [email or web address] 03/08/2014, 3:14 AM

## 2014-03-08 NOTE — Consult Note (Signed)
ORTHOPAEDIC CONSULTATION  REQUESTING PHYSICIAN: Sheila Oats, MD  Chief Complaint: Right hip fracture (valgus fem neck)  HPI: [name] [name] is a 68 y.o. female who complains of  Pain in the R leg  Past Medical History  Diagnosis Date  . Hypertension    Past Surgical History  Procedure Laterality Date  . Cholecystectomy    . Bladder surgery    . Abdominal hysterectomy     History   Social History  . Marital Status: Married    Spouse Name: N/A    Number of Children: N/A  . Years of Education: N/A   Social History Main Topics  . Smoking status: Never Smoker   . Smokeless tobacco: None  . Alcohol Use: No  . Drug Use: No  . Sexual Activity: None   Other Topics Concern  . None   Social History Narrative  . None   History reviewed. No pertinent family history. Allergies  Allergen Reactions  . Sulfa Antibiotics Nausea And Vomiting   Prior to Admission medications   Medication Sig Start Date End Date Taking? Authorizing Provider  Cholecalciferol (VITAMIN D PO) Take 800 mg by mouth daily.   Yes Historical Provider, MD  hydrochlorothiazide (HYDRODIURIL) 25 MG tablet Take 25 mg by mouth daily.   Yes Historical Provider, MD  propranolol ER (INDERAL LA) 80 MG 24 hr capsule Take 80 mg by mouth daily.   Yes Historical Provider, MD   Dg Hip Complete Right  03/08/2014   CLINICAL DATA:  Fall, pain  EXAM: RIGHT HIP - COMPLETE 2+ VIEW  COMPARISON:  None.  FINDINGS: There is no evidence of hip fracture or dislocation. There is no evidence of arthropathy or other focal bone abnormality. Mild bilateral hip DJD.  IMPRESSION: Negative.   Electronically Signed   By: Rolla Flatten M.D.   On: 03/08/2014 00:43   Ct Head Wo Contrast  03/08/2014   CLINICAL DATA:  Fall  EXAM: CT HEAD WITHOUT CONTRAST  CT CERVICAL SPINE WITHOUT CONTRAST  TECHNIQUE: Multidetector CT imaging of the head and cervical spine was performed following the standard protocol without intravenous contrast.  Multiplanar CT image reconstructions of the cervical spine were also generated.  COMPARISON:  None available.  FINDINGS: CT HEAD FINDINGS  Mild chronic microvascular ischemic changes noted within the supratentorial white matter. There is no acute intracranial hemorrhage or infarct. No mass lesion or midline shift. Gray-white matter differentiation is well maintained. Ventricles are normal in size without evidence of hydrocephalus. CSF containing spaces are within normal limits. No extra-axial fluid collection.  The calvarium is intact.  Orbital soft tissues are within normal limits.  The paranasal sinuses and mastoid air cells are well pneumatized and free of fluid.  Small right periorbital contusion with associated soft tissue emphysema is present.  CT CERVICAL SPINE FINDINGS  The vertebral bodies are normally aligned with preservation of the normal cervical lordosis. Vertebral body heights are preserved. Normal C1-2 articulations are intact. No prevertebral soft tissue swelling. No acute fracture or listhesis.  Moderate multilevel degenerative disc disease is evidenced by a intervertebral disc space narrowing and endplate osteophytosis is present, most severe at C3-4. Scattered facet arthropathy seen throughout the cervical spine.  Visualized soft tissues of the neck are within normal limits. Visualized lung apices are clear without evidence of apical pneumothorax. Irregular biapical pleural thickening noted.  IMPRESSION: CT BRAIN:  1. No acute intracranial abnormality. 2. Small right periorbital contusion/laceration. CT CERVICAL SPINE:  No acute traumatic  injury within the cervical spine.   Electronically Signed   By: Jeannine Boga M.D.   On: 03/08/2014 00:44   Ct Cervical Spine Wo Contrast  03/08/2014   CLINICAL DATA:  Fall  EXAM: CT HEAD WITHOUT CONTRAST  CT CERVICAL SPINE WITHOUT CONTRAST  TECHNIQUE: Multidetector CT imaging of the head and cervical spine was performed following the standard protocol  without intravenous contrast. Multiplanar CT image reconstructions of the cervical spine were also generated.  COMPARISON:  None available.  FINDINGS: CT HEAD FINDINGS  Mild chronic microvascular ischemic changes noted within the supratentorial white matter. There is no acute intracranial hemorrhage or infarct. No mass lesion or midline shift. Gray-white matter differentiation is well maintained. Ventricles are normal in size without evidence of hydrocephalus. CSF containing spaces are within normal limits. No extra-axial fluid collection.  The calvarium is intact.  Orbital soft tissues are within normal limits.  The paranasal sinuses and mastoid air cells are well pneumatized and free of fluid.  Small right periorbital contusion with associated soft tissue emphysema is present.  CT CERVICAL SPINE FINDINGS  The vertebral bodies are normally aligned with preservation of the normal cervical lordosis. Vertebral body heights are preserved. Normal C1-2 articulations are intact. No prevertebral soft tissue swelling. No acute fracture or listhesis.  Moderate multilevel degenerative disc disease is evidenced by a intervertebral disc space narrowing and endplate osteophytosis is present, most severe at C3-4. Scattered facet arthropathy seen throughout the cervical spine.  Visualized soft tissues of the neck are within normal limits. Visualized lung apices are clear without evidence of apical pneumothorax. Irregular biapical pleural thickening noted.  IMPRESSION: CT BRAIN:  1. No acute intracranial abnormality. 2. Small right periorbital contusion/laceration. CT CERVICAL SPINE:  No acute traumatic injury within the cervical spine.   Electronically Signed   By: Jeannine Boga M.D.   On: 03/08/2014 00:44   Ct Hip Right Wo Contrast  03/08/2014   CLINICAL DATA:  Right hip pain after a fall.  EXAM: CT OF THE RIGHT HIP WITHOUT CONTRAST  TECHNIQUE: Multidetector CT imaging was performed according to the standard protocol.  Multiplanar CT image reconstructions were also generated.  COMPARISON:  Right hip 03/08/2014  FINDINGS: Transverse impacted fracture of the subcapital right femoral neck without significant displacement. No evidence of dislocation of the hip joint. Visualized right hemipelvis appears intact. Minimal subcutaneous soft tissue hematoma over the right gluteal region. Visualized pelvic organs appear intact.  IMPRESSION: Transverse impacted fracture of the subcapital right femoral neck without significant displacement.   Electronically Signed   By: Lucienne Capers M.D.   On: 03/08/2014 02:10    Positive ROS: All other systems have been reviewed and were otherwise negative with the exception of those mentioned in the HPI and as above.  Labs cbc  Recent Labs  03/07/14 2330  WBC 12.0*  HGB 10.9*  HCT 33.5*  PLT 221    Labs inflam No results found for this basename: ESR, CRP,  in the last 72 hours  Labs coag  Recent Labs  03/07/14 2330  INR 1.04     Recent Labs  03/07/14 2330  NA 127*  K 2.8*  CL 86*  CO2 27  GLUCOSE 118*  BUN 18  CREATININE 0.72  CALCIUM 9.2    Physical Exam: Filed Vitals:   03/08/14 0401  BP: 122/71  Pulse: 73  Temp: 99.1 F (37.3 C)  Resp: 20   General: Alert, no acute distress Cardiovascular: No pedal edema Respiratory: No cyanosis,  no use of accessory musculature GI: No organomegaly, abdomen is soft and non-tender Skin: No lesions in the area of chief complaint Neurologic: Sensation intact distally Lymphatic: No axillary or cervical lymphadenopathy  MUSCULOSKELETAL:  RLE: pain with ROM, NVI.  Other extremities are atraumatic with painless ROM and NVI.  Assessment: R impacted fem neck fx.   Plan: Percutaneous pinning of R hip today around 3pm Weight Bearing Status: Bedrest for now, likely WBAT post op PT VTE px: SCD's and ASA post op   Edmonia Lynch, D, MD Cell [phone]   03/08/2014 5:04 AM

## 2014-03-08 NOTE — Op Note (Signed)
03/07/2014 - 03/08/2014  7:18 PM  PATIENT:  [name] [name]    PRE-OPERATIVE DIAGNOSIS:  right hip fx  POST-OPERATIVE DIAGNOSIS:  Same  PROCEDURE:  CANNULATED HIP PINNING  SURGEON:  Margarita RanaMURPHY, Jomarion Mish, D, MD  ASSISTANT: Janace LittenBrandon Parry OPA  ANESTHESIA:   General  PREOPERATIVE INDICATIONS:  [name] [name] is a  68 y.o. female who fell and was found to have a diagnosis of right hip fx who elected for surgical management.    The risks benefits and alternatives were discussed with the patient preoperatively including but not limited to the risks of infection, bleeding, nerve injury, cardiopulmonary complications, blood clots, malunion, nonunion, avascular necrosis, the need for revision surgery, the potential for conversion to hemiarthroplasty, among others, and the patient was willing to proceed.  OPERATIVE IMPLANTS: 6.5 mm cannulated screws x3  OPERATIVE FINDINGS: Clinical osteoporosis with weak bone, proximal femur  OPERATIVE PROCEDURE: The patient was brought to the operating room and placed in supine position. IV antibiotics were given. General anesthesia administered. Foley was also given. The patient was placed on the fracture table. The operative extremity was positioned, without any significant reduction maneuver and was prepped and draped in usual sterile fashion.  Time out was performed.  Small incisions were made distal to the greater trochanter, and 3 guidewires were introduced Into an inverted triangle configuration. The lengths were measured. The reduction was slightly valgus, and near-anatomic. I opened the cortex with a cannulated drill, and then placed the screws into position. Satisfactory fixation was achieved. I sequentially tightened the screws by hand.  I performed a live fluoroscopic exam and no screw penetrance was noted. All threads crossed the fracture site.   The wounds were irrigated copiously, and repaired with Vicryl with Steri-Strips and sterile gauze. There no  complications and the patient tolerated the procedure well.  The patient will be weightbearing as tolerated, VTE prophylaxis will be: ASA 325 daily

## 2014-03-08 NOTE — Transfer of Care (Signed)
Immediate Anesthesia Transfer of Care Note  Patient: Renne MuscaLinda A Griffeth  Procedure(s) Performed: Procedure(s): CANNULATED HIP PINNING (Right)  Patient Location: PACU  Anesthesia Type:General  Level of Consciousness: oriented, sedated, patient cooperative and responds to stimulation  Airway & Oxygen Therapy: Patient Spontanous Breathing and Patient connected to nasal cannula oxygen  Post-op Assessment: Report given to PACU RN, Post -op Vital signs reviewed and stable and Patient moving all extremities X 4  Post vital signs: Reviewed and stable  Complications: No apparent anesthesia complications

## 2014-03-09 DIAGNOSIS — E876 Hypokalemia: Secondary | ICD-10-CM

## 2014-03-09 MED ORDER — POTASSIUM CHLORIDE CRYS ER 20 MEQ PO TBCR
40.0000 meq | EXTENDED_RELEASE_TABLET | ORAL | Status: AC
  Administered 2014-03-09 (×2): 40 meq via ORAL
  Filled 2014-03-09 (×2): qty 2

## 2014-03-09 NOTE — Clinical Social Work Psychosocial (Addendum)
Clinical Social Work Department BRIEF PSYCHOSOCIAL ASSESSMENT 03/09/2014  Patient:  [name] [name], [name] [name]     Account Number:  [id number]     Admit date:  03/07/2014  Clinical Social Worker:  Hubert Azure  Date/Time:  03/09/2014 01:36 PM  Referred by:  Physician  Date Referred:  03/09/2014 Referred for  SNF Placement   Other Referral:   Interview type:  Other - See comment Other interview type:   CSW met with patient and spouse.    PSYCHOSOCIAL DATA Living Status:  HUSBAND Admitted from facility:   Level of care:   Primary support name:  Gaspar Skeeters Primary support relationship to patient:  SPOUSE Degree of support available:   Good. Patient's husband present at bedside.    CURRENT CONCERNS Current Concerns  Post-Acute Placement   Other Concerns:    SOCIAL WORK ASSESSMENT / PLAN CSW met with patient and husband [name] [name]. CSW introduced self and explained role. CSW discussed d/c plan with patient and spouse. Patient became visibly upset and stated she would not entertain any type of placement as she was "going home to [place], [place]" as soon as she could. Patient stated she didn't need therapy and was "okay" to travel the distance by car from [place] to [place]. Patient's husband then made patient aware that she would probably need some therapy for at least a few days, and probably at a facility. Patient then began to cry and tell husband, "no." CSW provided appropriate emotional support and asked patient what about the d/c plan had upset her. 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 CSW discussed the pros and cons of SNF with patient and informed patient therapy will assist in improving and strengthening mobility so she can return to activities of daily functioning. Patient stated she is getting stronger every time she moves in bed.  Patient's husband then stated he is agreeable with SNF if recommended by MD and PT. Patient reported she has the ultimate say in d/c plan and could easily receive PT at the [facility] once she returns to [place] or receive PT at home. CSW assured patient of her rights regarding hospitalization and making decisions in regard to d/c plan.    Patient's husband then stated this all occurred when they came down from [place] to attend grandson's graduation. Per husband, patient fell on steps outside of shower in the bathroom.   Assessment/plan status:  Information/Referral to [employer] Other assessment/ plan:   CSW will complete FL2 for SNF placement.   Information/referral to community resources:    PATIENT'S/FAMILY'S RESPONSE TO PLAN OF CARE: Patient was pleasant, but visibly upset about d/c plan. Patient has declined PT at a SNF insisting she could make it to [place], [place] without therapy and begin receiving therapy at [facility] or at home with PT after arriving in [place]. Patient's husband was pleasant, thanked CSW for assistance with d/c plan and spoke with CSW outside of patient's room to make CSW aware that once patient has spoken with MD and been evaluated by PT, then she will be able to make a more informed decision. Patient's husband reported patient is still extremely emotional about the incident and does not have a good view of SNF because of experience with family members and her mother dying at a SNF. Patient's husband stated it would be good if patient was  considered for inpatient rehab with [facility].    [name] [name] Weekend Clinical Social Worker [phone]

## 2014-03-09 NOTE — Progress Notes (Signed)
     Subjective:  Patient reports pain as mild.  Wants to get up, and wants to eat.  Orders still npo, except it also says advance diet as tolerated.  Objective:   VITALS:   Filed Vitals:   03/08/14 2043 03/08/14 2045 03/08/14 2115 03/09/14 0622  BP:   120/75 106/66  Pulse: 63 65 66 59  Temp:  98.4 F (36.9 C) 98.4 F (36.9 C) 97.8 F (36.6 C)  TempSrc:   Oral Oral  Resp: 16 20 19 18   Height:   5\' 8"  (1.727 m)   Weight:   71.532 kg (157 lb 11.2 oz)   SpO2: 99% 99% 97% 94%    Neurologically intact Dorsiflexion/Plantar flexion intact Incision: dressing C/D/I Bruising on right shoulderblade as well.  Lab Results  Component Value Date   WBC 12.0* 03/07/2014   HGB 10.9* 03/07/2014   HCT 33.5* 03/07/2014   MCV 82.3 03/07/2014   PLT 221 03/07/2014     Assessment/Plan: 1 Day Post-Op   Principal Problem:   Closed right hip fracture Active Problems:   Hypokalemia   HTN (hypertension)  Advance diet, PT, TTWB RLE.  Doing ok. ASA for DVT prophylaxis, with SCDs.   Neola Worrall P 03/09/2014, 9:53 AM   Teryl LucyJoshua Bryannah Boston, MD Cell [phone]

## 2014-03-09 NOTE — Clinical Social Work Placement (Signed)
Clinical Social Work Department CLINICAL SOCIAL WORK PLACEMENT NOTE 03/09/2014  Patient:  [name] [name],[name] [name]  Account Number:  [id number] Admit date:  03/07/2014  Clinical Social Worker:  Vivi Barrackrystal [name], LCSWA  Date/time:  03/09/2014 02:19 PM  Clinical Social Work is seeking post-discharge placement for this patient at the following level of care:   SKILLED NURSING   (*CSW will update this form in Epic as items are completed)   03/09/2014  Patient/family provided with [facility] of Clinical Social Work's list of facilities offering this level of care within the geographic area requested by the patient (or if unable, by the patient's family).  03/09/2014  Patient/family informed of their freedom to choose among providers that offer the needed level of care, that participate in Medicare, Medicaid or managed care program needed by the patient, have an available bed and are willing to accept the patient.  03/09/2014  Patient/family informed of [facility]' ownership interest in [facility], as well as of the fact that they are under no obligation to receive care at this facility.  PASARR submitted to EDS on 03/09/2014 PASARR number received on   FL2 transmitted to all facilities in geographic area requested by pt/family on   FL2 transmitted to all facilities within larger geographic area on   Patient informed that his/her managed care company has contracts with or will negotiate with  certain facilities, including the following:     Patient/family informed of bed offers received:   Patient chooses bed at  Physician recommends and patient chooses bed at    Patient to be transferred to  on   Patient to be transferred to facility by  Patient and family notified of transfer on  Name of family member notified:    The following physician request were entered in Epic:   Additional Comments:  [name] [name], LCSWA Weekend Clinical Social  Worker [phone]

## 2014-03-09 NOTE — Progress Notes (Signed)
[facility] PROGRESS NOTE  [name] MuscaLinda A Tienda WUJ:[id number] DOB: Jun 25, 1946 DOA: 03/07/2014 PCP: No PCP Per Patient  Assessment/Plan: Principal Problem:   Closed right hip fracture -Status post cannulated hip pinning on 6/12 per orthopedics -Continue pain management -PT following Active Problems:   Hypokalemia -Replace potassium   HTN (hypertension) -Continue propranolol    Code Status: Full Family Communication: Husband at bedside Disposition Plan: Pending PT eval   Consultants:  Orthopedics  Procedures: Status post: CANNULATED HIP PINNING 6/12    Antibiotics:  Cefazolin per orthopedics  HPI/Subjective: States she feels better today, ambulated in the hallway with PT  Objective: Filed Vitals:   03/09/14 0622  BP: 106/66  Pulse: 59  Temp: 97.8 F (36.6 C)  Resp: 18    Intake/Output Summary (Last 24 hours) at 03/09/14 1240 Last data filed at 03/09/14 [id number]  Gross per 24 hour  Intake    800 ml  Output      0 ml  Net    800 ml   Filed Weights   03/08/14 0401 03/08/14 2115  Weight: 69 kg (152 lb 1.9 oz) 71.532 kg (157 lb 11.2 oz)    Exam:  General: alert & oriented x 3 In NAD Cardiovascular: RRR, nl S1 s2 Respiratory: CTAB Abdomen: soft +BS NT/ND, no masses palpable Extremities: No cyanosis and no edema    Data Reviewed: Basic Metabolic Panel:  Recent Labs Lab 03/07/14 2330 03/08/14 0642  NA 127* 139  K 2.8* 3.2*  CL 86* 99  CO2 27 26  GLUCOSE 118* 106*  BUN 18 13  CREATININE 0.72 0.58  CALCIUM 9.2 8.8   Liver Function Tests:  Recent Labs Lab 03/07/14 2330  AST 19  ALT 13  ALKPHOS 59  BILITOT 0.3  PROT 7.3  ALBUMIN 3.8   No results found for this basename: LIPASE, AMYLASE,  in the last 168 hours No results found for this basename: AMMONIA,  in the last 168 hours CBC:  Recent Labs Lab 03/07/14 2330  WBC 12.0*  NEUTROABS 8.8*  HGB 10.9*  HCT 33.5*  MCV 82.3  PLT 221   Cardiac Enzymes: No results found for this  basename: CKTOTAL, CKMB, CKMBINDEX, TROPONINI,  in the last 168 hours BNP (last 3 results) No results found for this basename: PROBNP,  in the last 8760 hours CBG: No results found for this basename: GLUCAP,  in the last 168 hours  Recent Results (from the past 240 hour(s))  SURGICAL PCR SCREEN     Status: None   Collection Time    03/08/14  3:15 PM      Result Value Ref Range Status   MRSA, PCR NEGATIVE  NEGATIVE Final   Staphylococcus aureus NEGATIVE  NEGATIVE Final   Comment:            The Xpert SA Assay (FDA     approved for NASAL specimens     in patients over 69 years of age),     is one component of     a comprehensive surveillance     program.  Test performance has     been validated by [facility] for patients greater     than or equal to 69 year old.     It is not intended     to diagnose infection nor to     guide or monitor treatment.     Studies: Dg Hip Complete Right  03/08/2014   CLINICAL DATA:  Status post  ORIF right hip  EXAM: RIGHT HIP - COMPLETE 2+ VIEW  COMPARISON:  Intraoperative spot images dated 03/08/2014 at 1858 hr  FINDINGS: Status post ORIF of a subcapital right hip fracture with 3 cannulated cancellous screws.  Fracture fragments are in near anatomic alignment and position.  Visualized bony pelvis appears intact.  IMPRESSION: Status post ORIF of a right hip fracture, as above.   Electronically Signed   By: Charline Bills M.D.   On: 03/08/2014 20:47   Dg Hip Complete Right  03/08/2014   CLINICAL DATA:  Fall, pain  EXAM: RIGHT HIP - COMPLETE 2+ VIEW  COMPARISON:  None.  FINDINGS: There is no evidence of hip fracture or dislocation. There is no evidence of arthropathy or other focal bone abnormality. Mild bilateral hip DJD.  IMPRESSION: Negative.   Electronically Signed   By: Davonna Belling M.D.   On: 03/08/2014 00:43   Dg Hip Operative Right  03/08/2014   CLINICAL DATA:  Right hip fracture.  EXAM: DG OPERATIVE RIGHT HIP  TECHNIQUE: A single spot  fluoroscopic AP image of the right hip is submitted.  COMPARISON:  CT right hip from the same day.  FINDINGS: A single intraoperative film demonstrates 3 cannulated screws across the subcapital femoral neck fracture. The hip appears located.  IMPRESSION: Status post ORIF of the right subcapital femoral neck fracture without radiographic evidence for complication.   Electronically Signed   By: Gennette Pac M.D.   On: 03/08/2014 19:21   Ct Head Wo Contrast  03/08/2014   CLINICAL DATA:  Fall  EXAM: CT HEAD WITHOUT CONTRAST  CT CERVICAL SPINE WITHOUT CONTRAST  TECHNIQUE: Multidetector CT imaging of the head and cervical spine was performed following the standard protocol without intravenous contrast. Multiplanar CT image reconstructions of the cervical spine were also generated.  COMPARISON:  None available.  FINDINGS: CT HEAD FINDINGS  Mild chronic microvascular ischemic changes noted within the supratentorial white matter. There is no acute intracranial hemorrhage or infarct. No mass lesion or midline shift. Gray-white matter differentiation is well maintained. Ventricles are normal in size without evidence of hydrocephalus. CSF containing spaces are within normal limits. No extra-axial fluid collection.  The calvarium is intact.  Orbital soft tissues are within normal limits.  The paranasal sinuses and mastoid air cells are well pneumatized and free of fluid.  Small right periorbital contusion with associated soft tissue emphysema is present.  CT CERVICAL SPINE FINDINGS  The vertebral bodies are normally aligned with preservation of the normal cervical lordosis. Vertebral body heights are preserved. Normal C1-2 articulations are intact. No prevertebral soft tissue swelling. No acute fracture or listhesis.  Moderate multilevel degenerative disc disease is evidenced by a intervertebral disc space narrowing and endplate osteophytosis is present, most severe at C3-4. Scattered facet arthropathy seen throughout the  cervical spine.  Visualized soft tissues of the neck are within normal limits. Visualized lung apices are clear without evidence of apical pneumothorax. Irregular biapical pleural thickening noted.  IMPRESSION: CT BRAIN:  1. No acute intracranial abnormality. 2. Small right periorbital contusion/laceration. CT CERVICAL SPINE:  No acute traumatic injury within the cervical spine.   Electronically Signed   By: [name] [name] M.D.   On: 03/08/2014 00:44   Ct Cervical Spine Wo Contrast  03/08/2014   CLINICAL DATA:  Fall  EXAM: CT HEAD WITHOUT CONTRAST  CT CERVICAL SPINE WITHOUT CONTRAST  TECHNIQUE: Multidetector CT imaging of the head and cervical spine was performed following the standard protocol without intravenous contrast.  Multiplanar CT image reconstructions of the cervical spine were also generated.  COMPARISON:  None available.  FINDINGS: CT HEAD FINDINGS  Mild chronic microvascular ischemic changes noted within the supratentorial white matter. There is no acute intracranial hemorrhage or infarct. No mass lesion or midline shift. Gray-white matter differentiation is well maintained. Ventricles are normal in size without evidence of hydrocephalus. CSF containing spaces are within normal limits. No extra-axial fluid collection.  The calvarium is intact.  Orbital soft tissues are within normal limits.  The paranasal sinuses and mastoid air cells are well pneumatized and free of fluid.  Small right periorbital contusion with associated soft tissue emphysema is present.  CT CERVICAL SPINE FINDINGS  The vertebral bodies are normally aligned with preservation of the normal cervical lordosis. Vertebral body heights are preserved. Normal C1-2 articulations are intact. No prevertebral soft tissue swelling. No acute fracture or listhesis.  Moderate multilevel degenerative disc disease is evidenced by a intervertebral disc space narrowing and endplate osteophytosis is present, most severe at C3-4. Scattered facet  arthropathy seen throughout the cervical spine.  Visualized soft tissues of the neck are within normal limits. Visualized lung apices are clear without evidence of apical pneumothorax. Irregular biapical pleural thickening noted.  IMPRESSION: CT BRAIN:  1. No acute intracranial abnormality. 2. Small right periorbital contusion/laceration. CT CERVICAL SPINE:  No acute traumatic injury within the cervical spine.   Electronically Signed   By: [name] MuBenjamin  McClintock M.D.   On: 03/08/2014 00:44   Dg Pelvis Portable  03/08/2014   CLINICAL DATA:  Status post ORIF.  Right hip fracture.  EXAM: PORTABLE PELVIS 1-2 VIEWS  COMPARISON:  Intraoperative films from the same day.  FINDINGS: Three cannulated screws are in place. The screws cross the fracture. The pelvis is unremarkable. Degenerative changes are noted in the lower lumbar spine.  IMPRESSION: Status post ORIF of the right hip fracture without radiographic evidence for complication.   Electronically Signed   By: Gennette Pachris  Mattern M.D.   On: 03/08/2014 19:57   Ct Hip Right Wo Contrast  03/08/2014   CLINICAL DATA:  Right hip pain after a fall.  EXAM: CT OF THE RIGHT HIP WITHOUT CONTRAST  TECHNIQUE: Multidetector CT imaging was performed according to the standard protocol. Multiplanar CT image reconstructions were also generated.  COMPARISON:  Right hip 03/08/2014  FINDINGS: Transverse impacted fracture of the subcapital right femoral neck without significant displacement. No evidence of dislocation of the hip joint. Visualized right hemipelvis appears intact. Minimal subcutaneous soft tissue hematoma over the right gluteal region. Visualized pelvic organs appear intact.  IMPRESSION: Transverse impacted fracture of the subcapital right femoral neck without significant displacement.   Electronically Signed   By: Burman NievesWilliam  Stevens M.D.   On: 03/08/2014 02:10    Scheduled Meds: . aspirin EC  325 mg Oral Q breakfast  . propranolol ER  80 mg Oral Daily   Continuous  Infusions: . lactated ringers 20 mL/hr at 03/08/14 1639    Principal Problem:   Closed right hip fracture Active Problems:   Hypokalemia   HTN (hypertension)    Time spent: 35    [facility] CenterVIYUOH,Rajiv Parlato C  [facility] Pager [phone]. If 7PM-7AM, please contact night-coverage at [email or web address], password [email or web address] 03/09/2014, 12:40 PM  LOS: 2 days

## 2014-03-09 NOTE — Evaluation (Signed)
Physical Therapy Evaluation Patient Details Name: [name] [name] MRN: [id number] DOB: 03/12/1946 Today's Date: 03/09/2014   History of Present Illness  Patient is a 68 yo female from [place], visiting family.  Patient admitted 03/07/14 after a fall down steps with Lt hip fx.  Patient s/p Lt hip pinning.  PMH of HTN.  Clinical Impression  Patient presents with problems listed below.  Will benefit from acute PT to maximize independence prior to discharge. Patient wants to return to [place] when d/c from hospital.  Encouraged patient and husband to discuss with MD for medical issues.  Did very well with mobility/gait today.    Follow Up Recommendations Home health PT;Supervision/Assistance - 24 hour    Equipment Recommendations  Rolling walker with 5" wheels;3in1 (PT)    Recommendations for Other Services       Precautions / Restrictions Precautions Precautions: Fall Restrictions Weight Bearing Restrictions: Yes RLE Weight Bearing: Weight bearing as tolerated      Mobility  Bed Mobility Overal bed mobility: Needs Assistance Bed Mobility: Supine to Sit     Supine to sit: Min assist     General bed mobility comments: Verbal cues for technique.  Assist to move RLE off of bed.  Increased time for transition.  Transfers Overall transfer level: Needs assistance Equipment used: Rolling walker (2 wheeled) Transfers: Sit to/from Stand Sit to Stand: Min assist         General transfer comment: Verbal cues for hand placement and technique.  Assist to rise to standing from bed and 3-in-1, and for balance initially.  Ambulation/Gait Ambulation/Gait assistance: Min guard Ambulation Distance (Feet): 65 Feet Assistive device: Rolling walker (2 wheeled) Gait Pattern/deviations: Step-to pattern;Decreased stance time - right;Decreased step length - left;Decreased weight shift to right;Antalgic Gait velocity: Decreased Gait velocity interpretation: Below normal speed for age/gender General Gait  Details: Verbal cues for safe use of RW and gait sequence.   Gait improved with increased distance.  Able to ambulate 65'.  Stairs            Wheelchair Mobility    Modified Rankin (Stroke Patients Only)       Balance Overall balance assessment: Needs assistance Sitting-balance support: No upper extremity supported;Feet supported Sitting balance-Leahy Scale: Good     Standing balance support: Bilateral upper extremity supported Standing balance-Leahy Scale: Poor                               Pertinent Vitals/Pain Pain 9/10 Lt hip with mobility.  Emphasized importance of keeping pain under control to help with mobility and heeling.    Home Living Family/patient expects to be discharged to:: Private residence Living Arrangements: Spouse/significant other Available Help at Discharge: Family;Available 24 hours/day Type of Home: House Home Access: Stairs to enter Entrance Stairs-Rails: None Entrance Stairs-Number of Steps: 1 Home Layout: Two level;Able to live on main level with bedroom/bathroom Home Equipment: None      Prior Function Level of Independence: Independent         Comments: Very active.  Works in yard.     Hand Dominance        Extremity/Trunk Assessment   Upper Extremity Assessment: Overall WFL for tasks assessed           Lower Extremity Assessment: RLE deficits/detail RLE Deficits / Details: Decreased strength and ROM due to pain/surgery.    Cervical / Trunk Assessment: Normal  Communication   Communication: No difficulties  Cognition Arousal/Alertness: Awake/alert Behavior During Therapy: WFL for tasks assessed/performed Overall Cognitive Status: Within Functional Limits for tasks assessed                      General Comments      Exercises General Exercises - Lower Extremity Ankle Circles/Pumps: AROM;Both;10 reps;Seated      Assessment/Plan    PT Assessment Patient needs continued PT services   PT Diagnosis Difficulty walking;Generalized weakness;Acute pain   PT Problem List Decreased strength;Decreased range of motion;Decreased activity tolerance;Decreased balance;Decreased mobility;Decreased knowledge of use of DME;Pain  PT Treatment Interventions DME instruction;Gait training;Stair training;Functional mobility training;Therapeutic exercise;Patient/family education   PT Goals (Current goals can be found in the Care Plan section) Acute Rehab PT Goals Patient Stated Goal: To be able to return home in [place] at d/c. PT Goal Formulation: With patient/family Time For Goal Achievement: 03/16/14 Potential to Achieve Goals: Good    Frequency 7X/week   Barriers to discharge Inaccessible home environment Patient visiting from [place].  Does not want to return to son's home due to stairs (to get into house and inside house).  Wants to be able to leave hospital and return to home in [place].    Co-evaluation               End of Session Equipment Utilized During Treatment: Gait belt Activity Tolerance: Patient limited by pain;Patient limited by fatigue Patient left: in chair;with call bell/phone within reach;with family/visitor present Nurse Communication: Mobility status         Time: [id number] PT Time Calculation (min): 42 min   Charges:   PT Evaluation $Initial PT Evaluation Tier I: 1 Procedure PT Treatments $Gait Training: 23-37 mins   PT G Codes:          Vena AustriaDavis, Cici Rodriges H 03/09/2014, 3:08 PM Durenda HurtSusan H. Renaldo Fiddleravis, PT, Cj [facility] Acute Rehab Services Pager [phone]

## 2014-03-10 LAB — BASIC METABOLIC PANEL
BUN: 14 mg/dL (ref 6–23)
CALCIUM: 9.1 mg/dL (ref 8.4–10.5)
CHLORIDE: 102 meq/L (ref 96–112)
CO2: 26 mEq/L (ref 19–32)
CREATININE: 0.64 mg/dL (ref 0.50–1.10)
GFR calc non Af Amer: 89 mL/min — ABNORMAL LOW (ref >90)
Glucose, Bld: 149 mg/dL — ABNORMAL HIGH (ref 70–99)
Potassium: 4.3 mEq/L (ref 3.7–5.3)
Sodium: 141 mEq/L (ref 137–147)

## 2014-03-10 MED ORDER — BISACODYL 5 MG PO TBEC
10.0000 mg | DELAYED_RELEASE_TABLET | Freq: Every day | ORAL | Status: DC | PRN
  Administered 2014-03-10: 10 mg via ORAL
  Filled 2014-03-10: qty 2

## 2014-03-10 NOTE — Progress Notes (Signed)
[facility] PROGRESS NOTE  [name] MuscaLinda A [name] UEA:[id number] DOB: 07/12/1946 DOA: 03/07/2014 PCP: No PCP Per Patient  Assessment/Plan: Principal Problem:   Closed right hip fracture -Status post cannulated hip pinning on 6/12 per orthopedics -Continue pain management -discussed with pt and husband, will consult CIR and follow -PT following Active Problems:   Hypokalemia -Resolved   HTN (hypertension) -Continue propranolol    Code Status: Full Family Communication: Husband at bedside Disposition Plan: PT recommending HH PT vs CIR   Consultants:  Orthopedics  Procedures: Status post: CANNULATED HIP PINNING 6/12    Antibiotics:  Cefazolin per orthopedics  HPI/Subjective: Doing well today, denies any new complaints  Objective: Filed Vitals:   03/10/14 1009  BP: 115/64  Pulse: 62  Temp: 98.6 F (37 C)  Resp: 18    Intake/Output Summary (Last 24 hours) at 03/10/14 1353 Last data filed at 03/10/14 [id number]  Gross per 24 hour  Intake   1320 ml  Output      0 ml  Net   1320 ml   Filed Weights   03/08/14 0401 03/08/14 2115 03/09/14 2222  Weight: 69 kg (152 lb 1.9 oz) 71.532 kg (157 lb 11.2 oz) 69.945 kg (154 lb 3.2 oz)    Exam:  General: alert & oriented x 3 In NAD Cardiovascular: RRR, nl S1 s2 Respiratory: CTAB Abdomen: soft +BS NT/ND, no masses palpable Extremities: No cyanosis and no edema    Data Reviewed: Basic Metabolic Panel:  Recent Labs Lab 03/07/14 2330 03/08/14 0642 03/10/14 0900  NA 127* 139 141  K 2.8* 3.2* 4.3  CL 86* 99 102  CO2 27 26 26   GLUCOSE 118* 106* 149*  BUN 18 13 14   CREATININE 0.72 0.58 0.64  CALCIUM 9.2 8.8 9.1   Liver Function Tests:  Recent Labs Lab 03/07/14 2330  AST 19  ALT 13  ALKPHOS 59  BILITOT 0.3  PROT 7.3  ALBUMIN 3.8   No results found for this basename: LIPASE, AMYLASE,  in the last 168 hours No results found for this basename: AMMONIA,  in the last 168 hours CBC:  Recent Labs Lab  03/07/14 2330  WBC 12.0*  NEUTROABS 8.8*  HGB 10.9*  HCT 33.5*  MCV 82.3  PLT 221   Cardiac Enzymes: No results found for this basename: CKTOTAL, CKMB, CKMBINDEX, TROPONINI,  in the last 168 hours BNP (last 3 results) No results found for this basename: PROBNP,  in the last 8760 hours CBG: No results found for this basename: GLUCAP,  in the last 168 hours  Recent Results (from the past 240 hour(s))  SURGICAL PCR SCREEN     Status: None   Collection Time    03/08/14  3:15 PM      Result Value Ref Range Status   MRSA, PCR NEGATIVE  NEGATIVE Final   Staphylococcus aureus NEGATIVE  NEGATIVE Final   Comment:            The Xpert SA Assay (FDA     approved for NASAL specimens     in patients over 69 years of age),     is one component of     a comprehensive surveillance     program.  Test performance has     been validated by [facility] for patients greater     than or equal to 69 year old.     It is not intended     to diagnose infection nor to  guide or monitor treatment.     Studies: Dg Hip Complete Right  03/08/2014   CLINICAL DATA:  Status post ORIF right hip  EXAM: RIGHT HIP - COMPLETE 2+ VIEW  COMPARISON:  Intraoperative spot images dated 03/08/2014 at 1858 hr  FINDINGS: Status post ORIF of a subcapital right hip fracture with 3 cannulated cancellous screws.  Fracture fragments are in near anatomic alignment and position.  Visualized bony pelvis appears intact.  IMPRESSION: Status post ORIF of a right hip fracture, as above.   Electronically Signed   By: Charline BillsSriyesh  Krishnan M.D.   On: 03/08/2014 20:47   Dg Hip Operative Right  03/08/2014   CLINICAL DATA:  Right hip fracture.  EXAM: DG OPERATIVE RIGHT HIP  TECHNIQUE: A single spot fluoroscopic AP image of the right hip is submitted.  COMPARISON:  CT right hip from the same day.  FINDINGS: A single intraoperative film demonstrates 3 cannulated screws across the subcapital femoral neck fracture. The hip appears located.   IMPRESSION: Status post ORIF of the right subcapital femoral neck fracture without radiographic evidence for complication.   Electronically Signed   By: Gennette Pachris  Mattern M.D.   On: 03/08/2014 19:21   Dg Pelvis Portable  03/08/2014   CLINICAL DATA:  Status post ORIF.  Right hip fracture.  EXAM: PORTABLE PELVIS 1-2 VIEWS  COMPARISON:  Intraoperative films from the same day.  FINDINGS: Three cannulated screws are in place. The screws cross the fracture. The pelvis is unremarkable. Degenerative changes are noted in the lower lumbar spine.  IMPRESSION: Status post ORIF of the right hip fracture without radiographic evidence for complication.   Electronically Signed   By: Gennette Pachris  Mattern M.D.   On: 03/08/2014 19:57    Scheduled Meds: . aspirin EC  325 mg Oral Q breakfast  . propranolol ER  80 mg Oral Daily   Continuous Infusions: . lactated ringers 20 mL/hr at 03/08/14 1639    Principal Problem:   Closed right hip fracture Active Problems:   Hypokalemia   HTN (hypertension)    Time spent: 25    [facility] HospitalVIYUOH,[place] Agent C  [facility] Pager [phone]. If 7PM-7AM, please contact night-coverage at [email or web address], password [email or web address] 03/10/2014, 1:53 PM  LOS: 3 days

## 2014-03-10 NOTE — Progress Notes (Signed)
Occupational Therapy Evaluation Patient Details Name: [name] [name] MRN: [id number] DOB: 12-17-1945 Today's Date: 03/10/2014    History of Present Illness Patient is a 68 yo female from [place], visiting family.  Patient admitted 03/07/14 after a fall down steps with Lt hip fx.  Patient s/p Lt hip pinning.  PMH of HTN.   Clinical Impression   PTA pt was independent with ADLs and functional mobility. Pt declined OOB activities with OT at this time, however per PT note pt is [employer] level for transfers and ambulation. Pt would benefit from continued skilled OT to get pt to Mod Independent level prior to d/c home. Would benefit from practice with car transfers (if possible) and use of leg lifter/AE.     Follow Up Recommendations  No OT follow up;Supervision/Assistance - 24 hour    Equipment Recommendations  None recommended by OT (pt has RW and BSC)       Precautions / Restrictions Precautions Precautions: Fall Precaution Comments: Fall risk greatly decr with use of RW Restrictions Weight Bearing Restrictions: No RLE Weight Bearing: Weight bearing as tolerated      Mobility Bed Mobility Overal bed mobility: Modified Independent Bed Mobility: Supine to Sit     Supine to sit: Min guard     General bed mobility comments: Pt observed to transition from Supine to Sit EOB at Mod Independent level, moving RLE appropriately. HOB flat and no use of bed rails.   Transfers Overall transfer level: Needs assistance Equipment used: Rolling walker (2 wheeled) Transfers: Sit to/from Stand Sit to Stand: Min guard         General transfer comment: Pt declined opportunity to get OOB with this OT. However, moving well with bed mobility and Min Guard for transfers per PT.          ADL Overall ADL's : Needs assistance/impaired Eating/Feeding: Independent;Sitting   Grooming: Min guard;Standing   Upper Body Bathing: Set up;Sitting   Lower Body Bathing: Min guard;Sit to/from stand;With  adaptive equipment   Upper Body Dressing : Set up;Sitting   Lower Body Dressing: Min guard;With adaptive equipment;Sit to/from stand                 General ADL Comments: Pt declined OOB activities this date. Per PT, pt moving at [facility] level for ambulation. Educated and demonstrated use of AE (hip kit and leg lifter) to pt and husband for LB ADLs. Discussed importance of rest breaks (pt and husband plan to drive home to [place] following discharge) to reduce risk of DVT.  Educated pt and husband on getting into/out of car with use of towel and leg lifter and positioning seat for comfort. Also educated pt and wife on use of BSC near bed at night and in the shower when pt returns home. Education provided for fall prevention, energy conservation, and home safety.      Vision  Per pt report, no change from baseline. No apparent visual deficits.                   Perception Perception Perception Tested?: No   Praxis Praxis Praxis tested?: Within functional limits    Pertinent Vitals/Pain NAD, no c/o pain. Pt declined OOB as she "just walked with PT."     Hand Dominance Right   Extremity/Trunk Assessment Upper Extremity Assessment Upper Extremity Assessment: Overall WFL for tasks assessed   Lower Extremity Assessment Lower Extremity Assessment: Defer to PT evaluation   Cervical / Trunk Assessment Cervical /  Trunk Assessment: Normal   Communication Communication Communication: No difficulties   Cognition Arousal/Alertness: Awake/alert Behavior During Therapy: WFL for tasks assessed/performed Overall Cognitive Status: Within Functional Limits for tasks assessed                                Home Living Family/patient expects to be discharged to:: Private residence Living Arrangements: Spouse/significant other Available Help at Discharge: Family;Available 24 hours/day Type of Home: House Home Access: Stairs to enter [employer] of Steps:  1 Entrance Stairs-Rails: None Home Layout: Two level;Able to live on main level with bedroom/bathroom     Bathroom Shower/Tub: Occupational psychologist: Standard     Home Equipment: Environmental consultant - 2 wheels;Bedside commode ((DME is son's but pt can have it))   Additional Comments: Pt and husband plan to d/c and drive 8 hours home.      Prior Functioning/Environment Level of Independence: Independent        Comments: Very active.  Works in yard.    OT Diagnosis: Generalized weakness;Acute pain   OT Problem List: Decreased strength;Decreased range of motion;Decreased activity tolerance;Impaired balance (sitting and/or standing);Decreased safety awareness;Decreased knowledge of use of DME or AE;Pain   OT Treatment/Interventions: Self-care/ADL training;Therapeutic exercise;Energy conservation;DME and/or AE instruction;Therapeutic activities;Patient/family education;Balance training    OT Goals(Current goals can be found in the care plan section) Acute Rehab OT Goals Patient Stated Goal: To be able to return home in [place] at d/c. OT Goal Formulation: With patient/family Time For Goal Achievement: 03/17/14 Potential to Achieve Goals: Good ADL Goals Pt Will Perform Grooming: with modified independence;standing Pt Will Perform Lower Body Bathing: with modified independence;with adaptive equipment;sit to/from stand Pt Will Perform Lower Body Dressing: with modified independence;with adaptive equipment;sit to/from stand Pt Will Transfer to Toilet: with modified independence;ambulating;bedside commode Pt Will Perform Toileting - Clothing Manipulation and hygiene: with modified independence;sit to/from stand Pt Will Perform Tub/Shower Transfer: Shower transfer;with modified independence;ambulating;3 in 1;rolling walker  OT Frequency: Min 2X/week   Barriers to D/C: Other (comment)  Pt and husband live in [place]; report they cannot stay with son as he has left town.           End of  Session Equipment Utilized During Treatment: Other (comment) (AE (hip kit and leg lifter))  Activity Tolerance: Patient tolerated treatment well Patient left: in bed;with call bell/phone within reach;with family/visitor present   Time: 5643-3295 OT Time Calculation (min): 23 min Charges:  OT General Charges $OT Visit: 1 Procedure OT Evaluation $Initial OT Evaluation Tier I: 1 Procedure OT Treatments $Self Care/Home Management : 8-22 mins  Juluis Rainier [phone] 03/10/2014, 2:58 PM

## 2014-03-10 NOTE — Progress Notes (Signed)
     Subjective:  Patient reports pain as moderate.  No new complaints.  She is in the bathroom getting cleaned up, was able to take some steps.  Objective:   VITALS:   Filed Vitals:   03/08/14 2115 03/09/14 0622 03/09/14 1757 03/09/14 2222  BP: 120/75 106/66 109/66 114/66  Pulse: 66 59 65 62  Temp: 98.4 F (36.9 C) 97.8 F (36.6 C) 99.1 F (37.3 C) 98.2 F (36.8 C)  TempSrc: Oral Oral Oral Oral  Resp: 19 18 17 16   Height: 5\' 8"  (1.727 m)   5\' 8"  (1.727 m)  Weight: 71.532 kg (157 lb 11.2 oz)   69.945 kg (154 lb 3.2 oz)  SpO2: 97% 94% 93% 92%    Dorsiflexion/Plantar flexion intact Incision: dressing C/D/I and no drainage   Lab Results  Component Value Date   WBC 12.0* 03/07/2014   HGB 10.9* 03/07/2014   HCT 33.5* 03/07/2014   MCV 82.3 03/07/2014   PLT 221 03/07/2014     Assessment/Plan: 2 Days Post-Op   Principal Problem:   Closed right hip fracture Active Problems:   Hypokalemia   HTN (hypertension)   Advance diet Up with therapy Dispo per primary team.  Doing well overall.   Chianti Goh P 03/10/2014, 6:12 AM   Teryl LucyJoshua Dominik Lauricella, MD Cell [phone]

## 2014-03-10 NOTE — Clinical Social Work Note (Addendum)
CSW continues to follow this case. CSW met with patient and husband and discussed d/c plan. Per patient's husband, after speaking with MD the plan is for CIR placement pending evaluation with home with PT as a back up if patient requires additional therapy once d/c from CIR. CSW spoke with RN Vickii Chafe, who confirmed d/c plan and stated patient and husband have not yet spoken with attending MD. CSW made RNCM Hassan Rowan) aware of possible home with PT. CSW to continue to provide follow-up.  [place]berg, [place] Weekend Clinical Social Worker [phone]

## 2014-03-10 NOTE — Care Management Note (Signed)
CARE MANAGEMENT NOTE 03/10/2014  Patient:  [name] [name], [name] [name]   Account Number:  [id number]  Date Initiated:  03/08/2014  Documentation initiated by:  ROYAL,CHERYL  Subjective/Objective Assessment:   Referral for [facility] services.     Action/Plan:   Met with pt re role of CM and plan to follow for progression and d/c planning.   Anticipated DC Date:  03/11/2014   Anticipated DC Plan:  [place]  In-house referral  Clinical Social Worker      DC Planning Services  CM consult      [facility] Choice  HOME HEALTH   Choice offered to / List presented to:  C-3 Spouse   DME arranged  OTHER - SEE COMMENT      DME agency  NA        Status of service:  In process, will continue to follow Medicare Important Message given?   (If response is "NO", the following Medicare IM given date fields will be blank) Date Medicare IM given:   Date Additional Medicare IM given:    Discharge Disposition:    Per UR Regulation:    If discussed at Long Length of Stay Meetings, dates discussed:    Comments:  03/10/14-- 1427 ATIKAHALLRNCM [phone] Call received back from [facility], [name] [phone]. He states patient should be a good candidate for their program and that they recently began doing home health therapy at home. Requested that writer call Rosanna Randy who is in charge of the home health program at [phone] to make official referral. Fax number is [phone]. Left voicemail for Marita Kansas with [facility] for requested call back and also left weekday RNCM number on voicemail as well. Will make patient's husband aware. Therefore, will not fax information to [facility] at this time, only to [facility] per patient's husband's request. [name] [phone]   03/10/14-- ATIKAHALLRNCM [phone] Received call from [place] with [facility] who states they should be able to accept referral. Request orders and face to face be faxed to [phone]. Orders not in yet.CIR eval pending. However,  will fax demographics and H&P. Patient's PCP is Dr Bard Herbert at [facility]. Phone number for [facility] [phone]. Fax number is [phone]. Spoke with  husband again who states if patient does not go to CIR he would rather have [facility] Made him aware only a message was able to be left. Will await call back from [facility] if they call back. [name] [phone]  03/10/14 ATIKAHALLRNCM [phone] Received call from [place] on call who states she does not have a physical therapist available to see patient where she lives in [place], [place]. Therefore, unable to accept referral. States that [facility] is in the area and [facility] [phone] also provides home health therapy. Call placed 1st to [facility] to oncall to request call back regarding referral.   03/10/14-- 1321 ATIKAHALLRNCM [phone] Received call from [facility]. Gives another number call for referral [phone]. Called that number to make referral. Will await call back.  03/10/14 [place] Received notification that patient will return home to [place] with home health needs. Made numerous calls to [place] for home health agencies. SPoke with husband who states he prefers Psychiatric nurse. Mr Wiegman adamant that [facility] provides home health. However, unable to confirm that. Called [facility] at [phone]. Hours of operation are Monday thru Friday 7 am to 7 pm and Saturdays from 7 am to 12 noon. Closed on Sundays. Even called center  manager, [name] and left message at [phone] to see if they can take referral for home health. Have not received call back as of yet. Spoke with Mr Foisy again and discussed referral to [facility]. States he is familiar and is okay with referral to [facility]. Explained to Mr Carberry that [facility] also provides home health not just hospice services. [place] aat [phone] and left contact  information for call back with call center for referral. Awaiting call back. OF note, Mr Seidenberg states patient has rolling walker and 3 in 1 commode that she can use from her son. THerefore, no DME needs at this time.Still awaiting return call from home health agency, [facility]. Eudelia Hiltunen HALLRNCM [phone]

## 2014-03-10 NOTE — Progress Notes (Signed)
Physical Therapy Treatment Patient Details Name: [name] [name] MRN: [id number] DOB: 07-12-1946 Today's Date: 03/10/2014    History of Present Illness Patient is a 68 yo female from [place], visiting family.  Patient admitted 03/07/14 after a fall down steps with Lt hip fx.  Patient s/p Lt hip pinning.  PMH of HTN.    PT Comments    Continuing progress with functional mobility; Noted plan for hopefully CIR stay and then dc to car ride to [place]; Lengthy discussion re: qualifying for CIR, and based on today's session, pt may be too high level functionally to qualify for CIR, and she doesn't seem to fit the medically complex criterion either  Still, if CIR can take her for a short stay, that is of course a viable option.  Will aim our PT sessions at dc'ing Home to [place], and plan to stair train next session  Strongly recommend obtaining a handicap placard for their car   Follow Up Recommendations  Home health PT;Supervision/Assistance - 24 hour HHPT follow up in [place]     Equipment Recommendations  Rolling walker with 5" wheels;3in1 (PT);Other (comment) (and any OT recs)    Recommendations for Other Services OT consult     Precautions / Restrictions Precautions Precautions: Fall Precaution Comments: Fall risk greatly decr with use of RW Restrictions RLE Weight Bearing: Weight bearing as tolerated    Mobility  Bed Mobility Overal bed mobility: Needs Assistance Bed Mobility: Supine to Sit     Supine to sit: Min guard     General bed mobility comments: Cues for technique; Used rails  Transfers Overall transfer level: Needs assistance Equipment used: Rolling walker (2 wheeled) Transfers: Sit to/from Stand Sit to Stand: Min guard         General transfer comment: Verbal cues for hand placement and technique.   Ambulation/Gait Ambulation/Gait assistance: Min guard;Supervision Ambulation Distance (Feet): 150 Feet Assistive device: Rolling walker (2 wheeled) Gait  Pattern/deviations: Step-to pattern;Step-through pattern (emerging step-through pattern)     General Gait Details: Verbal cues for safe use of RW and gait sequence.   Gait improved with increased distance, with emerging step-through pattern; Minimal pain   Stairs            Wheelchair Mobility    Modified Rankin (Stroke Patients Only)       Balance           Standing balance support: Bilateral upper extremity supported Standing balance-Leahy Scale: Fair                      Cognition Arousal/Alertness: Awake/alert Behavior During Therapy: WFL for tasks assessed/performed Overall Cognitive Status: Within Functional Limits for tasks assessed                      Exercises Total Joint Exercises Ankle Circles/Pumps: AROM;Both;20 reps Quad Sets: AROM;Right;20 reps Gluteal Sets: AROM;Both;10 reps Towel Squeeze: AROM;Both;10 reps Heel Slides: AAROM;AROM;Right;10 reps Hip ABduction/ADduction: AAROM;Right;10 reps Bridges: AROM;Both;10 reps    General Comments        Pertinent Vitals/Pain 2/10 pain R hip with amb patient repositioned for comfort      Home Living                      Prior Function            PT Goals (current goals can now be found in the care plan section) Acute Rehab PT Goals Patient Stated Goal: To  [place] at [place]. PT Goal Formulation: With patient/family Time For Goal Achievement: 03/16/14 Potential to Achieve Goals: Good Progress towards PT goals: Progressing toward goals    Frequency  7X/week    PT Plan Current plan remains appropriate    Co-evaluation             End of Session Equipment Utilized During Treatment: Gait belt Activity Tolerance: Patient tolerated treatment well Patient left: in chair;with call bell/phone within reach;with family/visitor present     Time: [id number] PT Time Calculation (min): 32 min  Charges:  $Gait Training: 8-22 mins $Therapeutic  Exercise: 8-22 mins                    G Codes:      Olen PelGarrigan, Arti Trang Hamff 03/10/2014, 1:44 PM Van ClinesHolly Glynis Hunsucker, [place]PT  Acute Rehabilitation Services Pager [phone] Office [phone]

## 2014-03-11 ENCOUNTER — Encounter (HOSPITAL_COMMUNITY): Payer: Self-pay | Admitting: Orthopedic Surgery

## 2014-03-11 NOTE — Care Management Note (Signed)
CARE MANAGEMENT NOTE 03/11/2014  Patient:  [name] [name], [name] [name]   Account Number:  [id number]  Date Initiated:  03/08/2014  Documentation initiated by:  [name]  Subjective/Objective Assessment:   Referral for [facility] services.     Action/Plan:   Met with pt re role of CM and plan to follow for progression and d/c planning.   Anticipated DC Date:  03/11/2014   Anticipated DC Plan:  [place]  In-house referral  Clinical Social Worker      DC Planning Services  CM consult      [facility] Choice  HOME HEALTH   Choice offered to / List presented to:  C-3 Spouse   DME arranged  OTHER - SEE COMMENT      DME agency  NA        Status of service:  Completed, signed off Medicare Important Message given?  YES (If response is "NO", the following Medicare IM given date fields will be blank) Date Medicare IM given:  03/11/2014 Date Additional Medicare IM given:    Discharge Disposition:    Per UR Regulation:    If discussed at Long Length of Stay Meetings, dates discussed:    Comments:   03/11/2014 Met with pt and husband, notified that patient preference for [facility], has called to state that they will not be able to provide services for this pt unless she is a patient of [facility] and would require orders for [facility] from a MD or [place] from that facility. This was shared with the pt and her husband and they have declined further [facility] searches, as pt is doing very well. They will notify their PCP when they arrive at home and request him to order [facility] services if the pt needs this, currently the pt is ambulatory with a walker and able to get back and forth from the bathroom. This CM will provide d/c info to the pt upon d/c.  Noted that rolling walker and 3:1 have been delivered to pt room. Pt husband was given application for [employer]. [name] RN MPH, case manager, [phone]  03/10/14-- 1427 ATIKAHALLRNCM [phone] Call received back  from [facility], [name] [phone]. He states patient should be a good candidate for their program and that they recently began doing home health therapy at home. Requested that writer call Rosanna Randy who is in charge of the home health program at [phone] to make official referral. Fax number is [phone]. Left voicemail for Marita Kansas with [facility] for requested call back and also left weekday RNCM number on voicemail as well. Will make patient's husband aware. Therefore, will not fax information to [facility] at this time, only to [facility] per patient's husband's request. [name] [phone]   03/10/14-- ATIKAHALLRNCM [phone] Received call from [place] with [facility] who states they should be able to accept referral. Request orders and face to face be faxed to [phone]. Orders not in yet.CIR eval pending. However, will fax demographics and H&P. Patient's PCP is Dr Bard Herbert at [facility]. Phone number for [facility] [phone]. Fax number is [phone]. Spoke with  husband again who states if patient does not go to CIR he would rather have [facility]. Made him aware only a message was able to be left. Will await call back from [place] if they call back. [name] [phone]  03/10/14 ATIKAHALLRNCM [phone] Received call from [facility] RN on call who states she does not have a physical  therapist available to see patient where she lives in [place], [place]. Therefore, unable to accept referral. States that [facility] is in the area and [facility] [phone] also provides home health therapy. Call placed 1st to [facility] to oncall to request call back regarding referral.   03/10/14-- 1321 ATIKAHALLRNCM [phone] Received call from [facility]. Gives another number call for referral [phone]. Called that number to make referral. Will await call back.  03/10/14 [place] Received notification that patient will  return home to [place] with home health needs. Made numerous calls to [place] for home health agencies. SPoke with husband who states he prefers Psychiatric nurse. Mr Kibler adamant that [facility] provides home health. However, unable to confirm that. Called [facility] at [phone]. Hours of operation are Monday thru Friday 7 am to 7 pm and Saturdays from 7 am to 12 noon. Closed on Sundays. Even called center manager, [name] and left message at [phone] to see if they can take referral for home health. Have not received call back as of yet. Spoke with Mr Neiswender again and discussed referral to [facility]. States he is familiar and is okay with referral to [facility]. Explained to Mr Ly that [facility] also provides home health not just hospice services. [place] aat [phone] and left contact information for call back with call center for referral. Awaiting call back. OF note, Mr Countess states patient has rolling walker and 3 in 1 commode that she can use from her son. THerefore, no DME needs at this time.Still awaiting return call from home health agency, [place] [facility]. ATIKA HALLRNCM [phone]

## 2014-03-11 NOTE — Progress Notes (Signed)
Pt signed d/c papers. Instructions given on medication, s/sx of infection, mobility, exercise to do while driving and frequent stops for home. Pt instructed to set up follow up appt with orthopedic surgeon at home. Verbalized understanding. Will continue to monitor.

## 2014-03-11 NOTE — Progress Notes (Signed)
Thank you for consult on Ms [name] [name]. Chart reviewed and note that patient is Min-guard assist to supervision for mobility and ADLs. She is too high level for CIR and HHPT recommended for follow up. Will defer CIR consult for now.

## 2014-03-11 NOTE — Progress Notes (Signed)
Physical Therapy Treatment Patient Details Name: [name] [name] MRN: [id number] DOB: July 04, 1946 Today's Date: 03/11/2014    History of Present Illness Patient is a 68 yo female from [place], visiting family.  Patient admitted 03/07/14 after a fall down steps with R hip fx.  Patient s/p R hip pinning.  PMH of HTN.    PT Comments    Excellent progress with mobility; OK for dc home from PT standpoint; Moving well enough that there is not a pressing need for HHPT; Recommend Outpt PT to optimize healing  Paged MD re: pt's functional status  Acute PT goals met, and likely dc today; Will dc acute PT services   Follow Up Recommendations  Outpatient PT;Supervision/Assistance - 24 hour     Equipment Recommendations  None recommended by PT (pt will use equipment from her son; goo fit of RW)    Recommendations for Other Services       Precautions / Restrictions Precautions Precautions: None Restrictions Weight Bearing Restrictions: No RLE Weight Bearing: Weight bearing as tolerated    Mobility  Bed Mobility               General bed mobility comments: Pt in chair at beginning of OT tx.  Transfers Overall transfer level: Modified independent Equipment used: Rolling walker (2 wheeled) Transfers: Sit to/from Stand Sit to Stand: Modified independent (Device/Increase time)         General transfer comment: great improvements; now modified independent  Ambulation/Gait Ambulation/Gait assistance: Modified independent (Device/Increase time) Ambulation Distance (Feet): 300 Feet Assistive device: Rolling walker (2 wheeled) Gait Pattern/deviations: Step-through pattern     General Gait Details: Adjusted pt's RW to optimal height   Stairs Stairs: Yes Stairs assistance: Min guard Stair Management: No rails;Forwards;Backwards;With walker Number of Stairs: 1 (x2) General stair comments: Cues for sequence  Wheelchair Mobility    Modified Rankin (Stroke Patients Only)        Balance Overall balance assessment: Needs assistance   Sitting balance-Leahy Scale: Normal       Standing balance-Leahy Scale: Good                      Cognition Arousal/Alertness: Awake/alert Behavior During Therapy: WFL for tasks assessed/performed;Impulsive Overall Cognitive Status: Within Functional Limits for tasks assessed                      Exercises Total Joint Exercises Provided handout with surgical hip exercises Pt declining therex today    General Comments        Pertinent Vitals/Pain Minimal pain, "maybe a one"    Home Living                      Prior Function            PT Goals (current goals can now be found in the care plan section) Acute Rehab PT Goals Patient Stated Goal: to get on the road Progress towards PT goals: Goals met/education completed, patient discharged from PT    Frequency  7X/week    PT Plan Current plan remains appropriate    Co-evaluation             End of Session   Activity Tolerance: Patient tolerated treatment well Patient left: in chair;with call bell/phone within reach;with family/visitor present     Time: 3419-6222 PT Time Calculation (min): 24 min  Charges:  $Gait Training: 23-37 mins  G Codes:      Roney Marion Hamff 03/11/2014, 11:05 AM  Roney Marion, PT  Acute Rehabilitation Services Pager [phone] Office [phone]

## 2014-03-11 NOTE — Progress Notes (Addendum)
I have read and agree with this note.   Time in/out: 8:38-8:57 Total time: 19 minutes (1 [place])  Ignacia Palmaathy Giuliano Preece, OTR/L [phone]

## 2014-03-11 NOTE — Progress Notes (Signed)
Occupational Therapy Treatment and Discharge Patient Details Name: [name] [name] MRN: [id number] DOB: [birth year] Today's Date: 03/11/2014    History of present illness Patient is a 68 yo female from [place], visiting family.  Patient admitted 03/07/14 after a fall down steps with R hip fx.  Patient s/p R hip pinning.  PMH of HTN.   OT comments  Focus of today's session was on toilet transfers, LB dressing and grooming. Pt is progressing towards goals and demonstrated [facility] and regular toilet using the grab bar for support for toileting during their drive home to [place] upon d/c. Educated pt on the importance of rest breaks and different seating positions to accommodate her hip and reduce the risk of DVT. Pt has DME and stated that she does not need any AE to assist with LB bathing/dressing. Pt will have assistance at home from husband if needed therefore no further OT is needed, we will sign off.  Follow Up Recommendations  No OT follow up;Supervision/Assistance - 24 hour    Equipment Recommendations  None recommended by OT (has RW and BSC)       Precautions / Restrictions Precautions Precautions: Fall Restrictions Weight Bearing Restrictions: No RLE Weight Bearing: Weight bearing as tolerated       Mobility Bed Mobility               General bed mobility comments: Pt in chair at beginning of OT tx.  Transfers Overall transfer level: Modified independent Equipment used: Rolling walker (2 wheeled) Transfers: Sit to/from Stand Sit to Stand: Modified independent (Device/Increase time)              Balance Overall balance assessment: Needs assistance   Sitting balance-Leahy Scale: Good       Standing balance-Leahy Scale: Fair                     ADL Overall ADL's : Needs assistance/impaired     Grooming: Wash/dry hands;Standing;Modified independent               Lower Body Dressing: Sit to/from stand;Modified independent   Toilet  Transfer: BSC;RW;Ambulation;Modified Community education officerndependent Toilet Transfer Details (indicate cue type and reason): practiced toilet transfer to regular toilet for car ride home to [place] upon d/c Toileting- Clothing Manipulation and Hygiene: Sit to/from stand;Modified independent       Functional mobility during ADLs: Modified independent;Rolling walker General ADL Comments: Pt demonstrated proper toilet [facility] to [facility] and regular toilet for toileting during car ride home to [place] upon d/c. Pt educated on the importance of rest breaks during the drive home to reduce the risk of DVT. Pt stated that she did not need any AE to help with LB bathing/dressing and demostrated her ability to perform LB dressing by taking her sock on/off. Pt has shower seat, hand held shower and 3n1 at home.                Cognition   Behavior During Therapy: WFL for tasks assessed/performed Overall Cognitive Status: Within Functional Limits for tasks assessed                                    Pertinent Vitals/ Pain       No c/o pain, just tightness around incision area.         Frequency Min 2X/week     Progress Toward Goals  OT  Goals(current goals can now be found in the care plan section)  Progress towards OT goals: Progressing toward goals  Acute Rehab OT Goals Patient Stated Goal: [place] able [place] at [place]. OT Goal Formulation: With patient/family Time For Goal Achievement: 03/17/14 Potential to Achieve Goals: Good  Plan Discharge plan remains appropriate       End of Session Equipment Utilized During Treatment: Rolling walker   Activity Tolerance Patient tolerated treatment well   Patient Left in chair;with call bell/phone within reach;with family/visitor present   Nurse Communication          Time:  -     Charges:    [name] [name], [name] [name] 03/11/2014, 10:01 AM

## 2014-03-11 NOTE — Progress Notes (Signed)
Prescriptions given. 

## 2014-03-11 NOTE — Discharge Summary (Signed)
Physician Discharge Summary  [name] [name] ZOX:[id number] DOB: 05/08/1946 DOA: 03/07/2014  PCP: No PCP Per Patient  Admit date: 03/07/2014 Discharge date: 03/11/2014  Time spent: >30 minutes  Recommendations for Outpatient Follow-up:  Follow-up Information   Follow up with Margarita RanaMURPHY, TIMOTHY, D, MD In 2 weeks. (or Orthopedic surgeon in [place] in 2weeks, call for appt upon discharge)    Specialty:  Orthopedic Surgery   Contact information:   [address] [place] [address] [phone]       Please follow up. (PCP in [place] 1-2wks, call for appt upon discharge)        Discharge Diagnoses:  Principal Problem:   Closed right hip fracture Active Problems:   Hypokalemia   HTN (hypertension)   Discharge Condition: Improved/stable  Diet recommendation: Low sodium heart healthy  Filed Weights   03/08/14 2115 03/09/14 2222 03/10/14 2204  Weight: 71.532 kg (157 lb 11.2 oz) 69.945 kg (154 lb 3.2 oz) 70.716 kg (155 lb 14.4 oz)    History of present illness:  The patient is a 68 y.o. female with history of hypertension who presented to the ED after a fall down 3 or 4 steps. She landed on her R hip, also struck head but no LOC. She had R hip pain after fall, worse with movement, better when lying still. Unable to ambulate   Hospital Course:  Principal Problem:  Closed right hip fracture  As discussed upon admission patient had a CT scan of her right hip which showed transverse impacted fracture of the subcapital right femoral neck without significant displacement. -She was started on narcotics for pain management, orthopedics was consulted -Dr. Eulah PontMurphy saw patient and she was taken to oral and 6/12 -Status post cannulated hip pinning on 6/12 per orthopedics  -PT was consulted and followed patient and she is done well, and the recommend home with home health at this time. -It is noted that they are indicated that she was to high functioning to need inpatient  rehabilitation -Discussed with Dr. Eulah PontMurphy this a.m. and he recommends to continue aspirin for 30 days for post hip surgery DVT prophylaxis, and [place] for wound check. Active Problems:  Hypokalemia  -Her potassium was replaced in the hospital HTN (hypertension)  -She was maintained on propranolol in the hospital and she is to continue her outpatient medications upon discharge.  Consultants:  Orthopedics Procedures:  Status post: CANNULATED HIP PINNING 6/12   Discharge Exam: Filed Vitals:   03/11/14 0800  BP:   Pulse:   Temp:   Resp: 16    NoExam:  General: alert & oriented x 3 In NAD  Cardiovascular: RRR, nl S1 s2  Respiratory: CTAB  Abdomen: soft +BS NT/ND, no masses palpable  Extremities: No cyanosis and no edema That of his level:e any questions about your discharge medications or the care you received while you were in the hospital after you are discharged, you can call the unit and asked to speak with the hospitalist on call if the hospitalist that took care of you is not available. Once you are discharged, your primary care physician will handle any further medical issues. Please note that NO REFILLS for any discharge medications will be authorized once you are discharged, as it is imperative that you return to your primary care physician (or establish a relationship with a primary care physician if you do not have one) for your aftercare needs so that they can  reassess your need for medications and monitor your lab values.  Discharge Instructions   Diet - low sodium heart healthy    Complete by:  As directed      Increase activity slowly    Complete by:  As directed      Weight bearing as tolerated    Complete by:  As directed             Medication List         aspirin EC 325 MG tablet  Take 1 tablet (325 mg total) by mouth daily.     docusate sodium 100 MG capsule  Commonly known as:  COLACE  Take 1 capsule  (100 mg total) by mouth 2 (two) times daily. Continue this while taking narcotics to help with bowel movements     hydrochlorothiazide 25 MG tablet  Commonly known as:  HYDRODIURIL  Take 25 mg by mouth daily.     HYDROcodone-acetaminophen 5-325 MG per tablet  Commonly known as:  NORCO  Take 1-2 tablets by mouth every 4 (four) hours as needed for moderate pain.     ondansetron 4 MG tablet  Commonly known as:  ZOFRAN  Take 1 tablet (4 mg total) by mouth every 8 (eight) hours as needed for nausea.     propranolol ER 80 MG 24 hr capsule  Commonly known as:  INDERAL LA  Take 80 mg by mouth daily.     VITAMIN D PO  Take 800 mg by mouth daily.       Allergies  Allergen Reactions  . Sulfa Antibiotics Nausea And Vomiting       Follow-up Information   Follow up with MURPHY, TIMOTHY, D, MD In 2 weeks. (or Orthopedic surgeon in [place] in 2weeks, call for appt upon discharge)    Specialty:  Orthopedic Surgery   Contact information:   [address] [place] [phone] [phone]       Please follow up. (PCP in [place] 1-2wks, call for appt upon discharge)        The results of significant diagnostics from this hospitalization (including imaging, microbiology, ancillary and laboratory) are listed below for reference.    Significant Diagnostic Studies: Dg Hip Complete Right  03/08/2014   CLINICAL DATA:  Status post ORIF right hip  EXAM: RIGHT HIP - COMPLETE 2+ VIEW  COMPARISON:  Intraoperative spot images dated 03/08/2014 at 1858 hr  FINDINGS: Status post ORIF of a subcapital right hip fracture with 3 cannulated cancellous screws.  Fracture fragments are in near anatomic alignment and position.  Visualized bony pelvis appears intact.  IMPRESSION: Status post ORIF of a right hip fracture, as above.   Electronically Signed   By: Charline BillsSriyesh  Krishnan M.D.   On: 03/08/2014 20:47   Dg Hip Complete Right  03/08/2014   CLINICAL DATA:  Fall, pain  EXAM: RIGHT HIP - COMPLETE 2+  VIEW  COMPARISON:  None.  FINDINGS: There is no evidence of hip fracture or dislocation. There is no evidence of arthropathy or other focal bone abnormality. Mild bilateral hip DJD.  IMPRESSION: Negative.   Electronically Signed   By: Davonna BellingJohn  Curnes M.D.   On: 03/08/2014 00:43   Dg Hip Operative Right  03/08/2014   CLINICAL DATA:  Right hip fracture.  EXAM: DG OPERATIVE RIGHT HIP  TECHNIQUE: A single spot fluoroscopic AP image of the right hip is submitted.  COMPARISON:  CT right hip from the same day.  FINDINGS: A single intraoperative  film demonstrates 3 cannulated screws across the subcapital femoral neck fracture. The hip appears located.  IMPRESSION: Status post ORIF of the right subcapital femoral neck fracture without radiographic evidence for complication.   Electronically Signed   By: Gennette Pac M.D.   On: 03/08/2014 19:21   Ct Head Wo Contrast  03/08/2014   CLINICAL DATA:  Fall  EXAM: CT HEAD WITHOUT CONTRAST  CT CERVICAL SPINE WITHOUT CONTRAST  TECHNIQUE: Multidetector CT imaging of the head and cervical spine was performed following the standard protocol without intravenous contrast. Multiplanar CT image reconstructions of the cervical spine were also generated.  COMPARISON:  None available.  FINDINGS: CT HEAD FINDINGS  Mild chronic microvascular ischemic changes noted within the supratentorial white matter. There is no acute intracranial hemorrhage or infarct. No mass lesion or midline shift. Gray-white matter differentiation is well maintained. Ventricles are normal in size without evidence of hydrocephalus. CSF containing spaces are within normal limits. No extra-axial fluid collection.  The calvarium is intact.  Orbital soft tissues are within normal limits.  The paranasal sinuses and mastoid air cells are well pneumatized and free of fluid.  Small right periorbital contusion with associated soft tissue emphysema is present.  CT CERVICAL SPINE FINDINGS  The vertebral bodies are normally  aligned with preservation of the normal cervical lordosis. Vertebral body heights are preserved. Normal C1-2 articulations are intact. No prevertebral soft tissue swelling. No acute fracture or listhesis.  Moderate multilevel degenerative disc disease is evidenced by a intervertebral disc space narrowing and endplate osteophytosis is present, most severe at C3-4. Scattered facet arthropathy seen throughout the cervical spine.  Visualized soft tissues of the neck are within normal limits. Visualized lung apices are clear without evidence of apical pneumothorax. Irregular biapical pleural thickening noted.  IMPRESSION: CT BRAIN:  1. No acute intracranial abnormality. 2. Small right periorbital contusion/laceration. CT CERVICAL SPINE:  No acute traumatic injury within the cervical spine.   Electronically Signed   By: Rise Mu M.D.   On: 03/08/2014 00:44   Ct Cervical Spine Wo Contrast  03/08/2014   CLINICAL DATA:  Fall  EXAM: CT HEAD WITHOUT CONTRAST  CT CERVICAL SPINE WITHOUT CONTRAST  TECHNIQUE: Multidetector CT imaging of the head and cervical spine was performed following the standard protocol without intravenous contrast. Multiplanar CT image reconstructions of the cervical spine were also generated.  COMPARISON:  None available.  FINDINGS: CT HEAD FINDINGS  Mild chronic microvascular ischemic changes noted within the supratentorial white matter. There is no acute intracranial hemorrhage or infarct. No mass lesion or midline shift. Gray-white matter differentiation is well maintained. Ventricles are normal in size without evidence of hydrocephalus. CSF containing spaces are within normal limits. No extra-axial fluid collection.  The calvarium is intact.  Orbital soft tissues are within normal limits.  The paranasal sinuses and mastoid air cells are well pneumatized and free of fluid.  Small right periorbital contusion with associated soft tissue emphysema is present.  CT CERVICAL SPINE FINDINGS  The  vertebral bodies are normally aligned with preservation of the normal cervical lordosis. Vertebral body heights are preserved. Normal C1-2 articulations are intact. No prevertebral soft tissue swelling. No acute fracture or listhesis.  Moderate multilevel degenerative disc disease is evidenced by a intervertebral disc space narrowing and endplate osteophytosis is present, most severe at C3-4. Scattered facet arthropathy seen throughout the cervical spine.  Visualized soft tissues of the neck are within normal limits. Visualized lung apices are clear without evidence of apical pneumothorax. Irregular  biapical pleural thickening noted.  IMPRESSION: CT BRAIN:  1. No acute intracranial abnormality. 2. Small right periorbital contusion/laceration. CT CERVICAL SPINE:  No acute traumatic injury within the cervical spine.   Electronically Signed   By: Rise Mu M.D.   On: 03/08/2014 00:44   Dg Pelvis Portable  03/08/2014   CLINICAL DATA:  Status post ORIF.  Right hip fracture.  EXAM: PORTABLE PELVIS 1-2 VIEWS  COMPARISON:  Intraoperative films from the same day.  FINDINGS: Three cannulated screws are in place. The screws cross the fracture. The pelvis is unremarkable. Degenerative changes are noted in the lower lumbar spine.  IMPRESSION: Status post ORIF of the right hip fracture without radiographic evidence for complication.   Electronically Signed   By: Gennette Pac M.D.   On: 03/08/2014 19:57   Ct Hip Right Wo Contrast  03/08/2014   CLINICAL DATA:  Right hip pain after a fall.  EXAM: CT OF THE RIGHT HIP WITHOUT CONTRAST  TECHNIQUE: Multidetector CT imaging was performed according to the standard protocol. Multiplanar CT image reconstructions were also generated.  COMPARISON:  Right hip 03/08/2014  FINDINGS: Transverse impacted fracture of the subcapital right femoral neck without significant displacement. No evidence of dislocation of the hip joint. Visualized right hemipelvis appears intact. Minimal  subcutaneous soft tissue hematoma over the right gluteal region. Visualized pelvic organs appear intact.  IMPRESSION: Transverse impacted fracture of the subcapital right femoral neck without significant displacement.   Electronically Signed   By: Burman Nieves M.D.   On: 03/08/2014 02:10    Microbiology: Recent Results (from the past 240 hour(s))  SURGICAL PCR SCREEN     Status: None   Collection Time    03/08/14  3:15 PM      Result Value Ref Range Status   MRSA, PCR NEGATIVE  NEGATIVE Final   Staphylococcus aureus NEGATIVE  NEGATIVE Final   Comment:            The Xpert SA Assay (FDA     approved for NASAL specimens     in patients over 53 years of age),     is one component of     a comprehensive surveillance     program.  Test performance has     been validated by [employer] for patients greater     than or equal to 69 year old.     It is not intended     to diagnose infection nor to     guide or monitor treatment.     Labs: Basic Metabolic Panel:  Recent Labs Lab 03/07/14 2330 03/08/14 0642 03/10/14 0900  NA 127* 139 141  K 2.8* 3.2* 4.3  CL 86* 99 102  CO2 27 26 26   GLUCOSE 118* 106* 149*  BUN 18 13 14   CREATININE 0.72 0.58 0.64  CALCIUM 9.2 8.8 9.1   Liver Function Tests:  Recent Labs Lab 03/07/14 2330  AST 19  ALT 13  ALKPHOS 59  BILITOT 0.3  PROT 7.3  ALBUMIN 3.8   No results found for this basename: LIPASE, AMYLASE,  in the last 168 hours No results found for this basename: AMMONIA,  in the last 168 hours CBC:  Recent Labs Lab 03/07/14 2330  WBC 12.0*  NEUTROABS 8.8*  HGB 10.9*  HCT 33.5*  MCV 82.3  PLT 221   Cardiac Enzymes: No results found for this basename: CKTOTAL, CKMB, CKMBINDEX, TROPONINI,  in the last 168 hours BNP: BNP (  last 3 results) No results found for this basename: PROBNP,  in the last 8760 hours CBG: No results found for this basename: GLUCAP,  in the last 168 hours     Signed:  Kela Millin  [facility] 03/11/2014, 12:22 PM

## 2014-03-11 NOTE — Care Management Note (Deleted)
CARE MANAGEMENT NOTE 03/11/2014  Patient:  [name],[name]   Account Number:  [id number]  Date Initiated:  03/11/2014  Documentation initiated by:  Nayellie Sanseverino  Subjective/Objective Assessment:   Note referral for glucometer and medication.     Action/Plan:   Noted that pt has Medicaid therefore has ability to purchase meds for a minimal amount. Recommend [facility] for glucometer, she should discuss the pharmacist to learn which offers the least expensive strips to control ongoing cost.   Anticipated DC Date:     Anticipated DC Plan:  HOME/SELF CARE         Choice offered to / List presented to:             Status of service:  Completed, signed off Medicare Important Message given?   (If response is "NO", the following Medicare IM given date fields will be blank) Date Medicare IM given:   Date Additional Medicare IM given:    Discharge Disposition:    Per UR Regulation:    If discussed at Long Length of Stay Meetings, dates discussed:    Comments:    

## 2014-10-10 ENCOUNTER — Encounter (HOSPITAL_COMMUNITY): Payer: Self-pay | Admitting: Orthopedic Surgery

## 2015-10-04 IMAGING — CR DG HIP COMPLETE 2+V*R*
3 series · 3 of 3 positions shown · non-contrast
Comparison: Intraoperative spot images dated 03/08/2014 at 5717 hr

CLINICAL DATA: Status post ORIF right hip

EXAM:
RIGHT HIP - COMPLETE 2+ VIEW

[towne view]
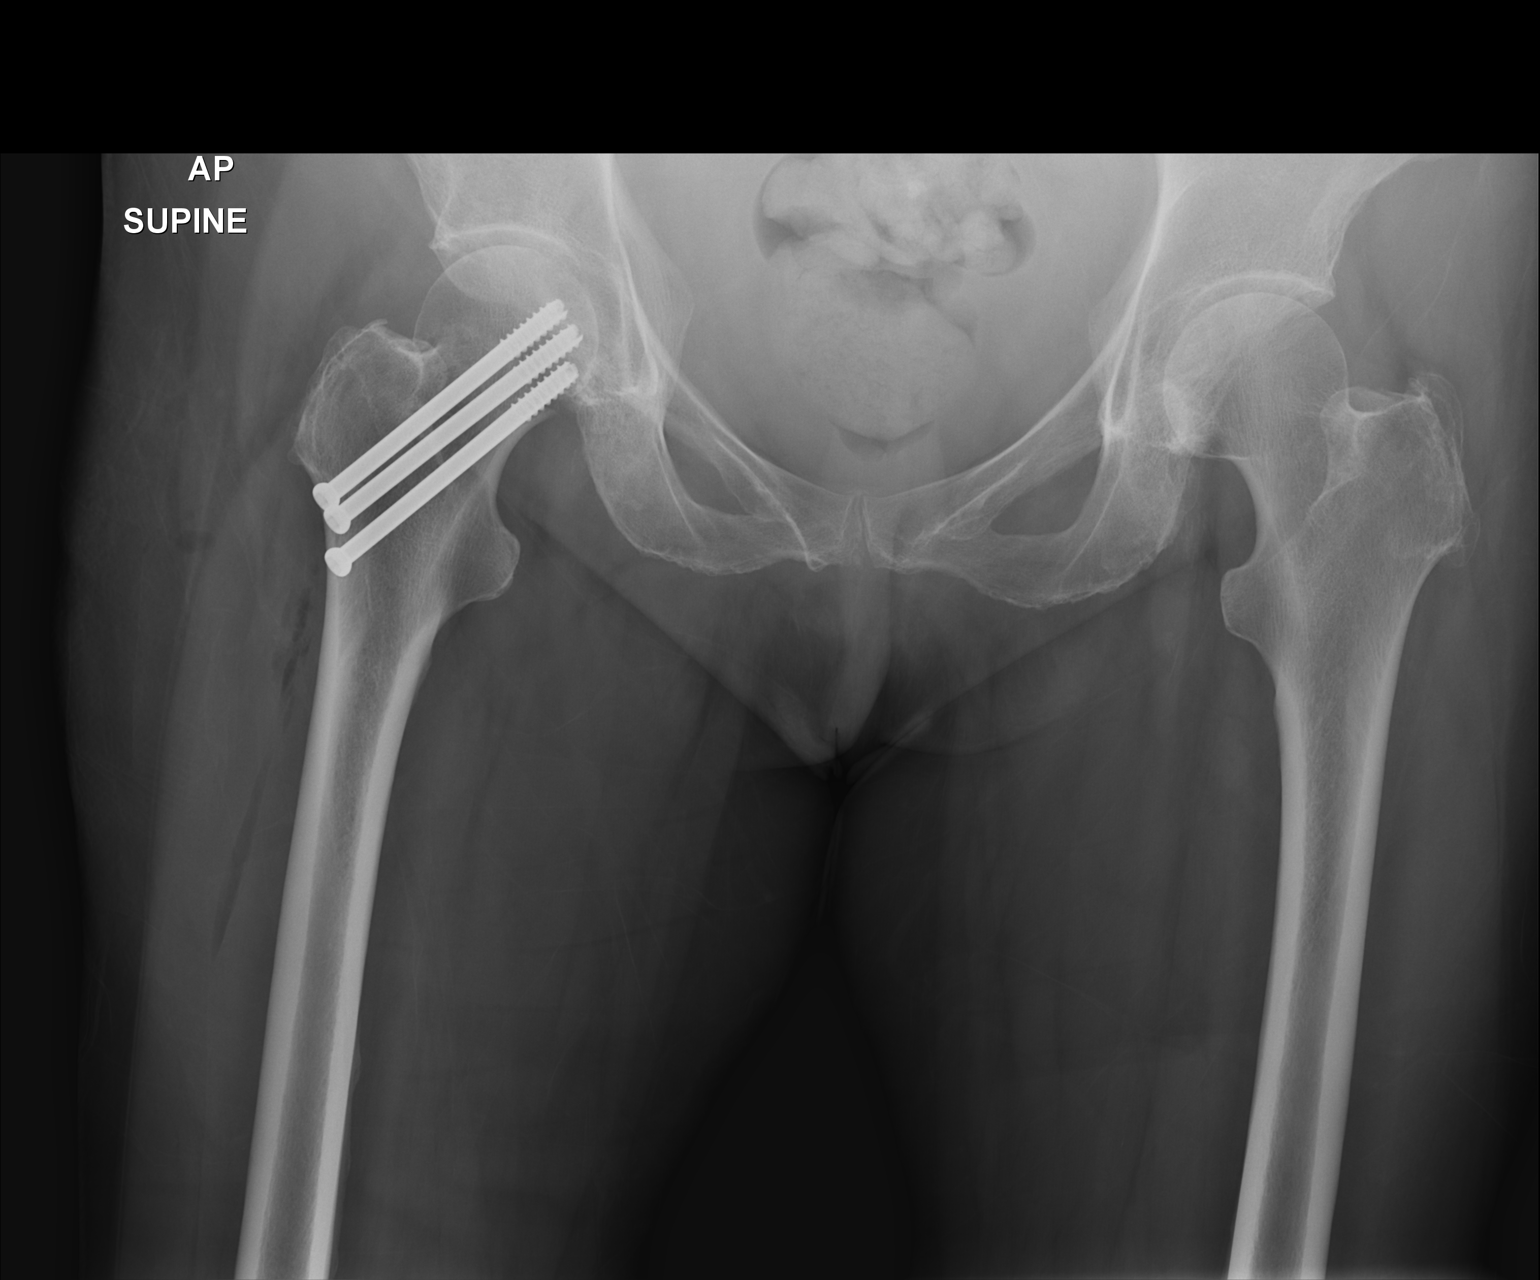

[AP]
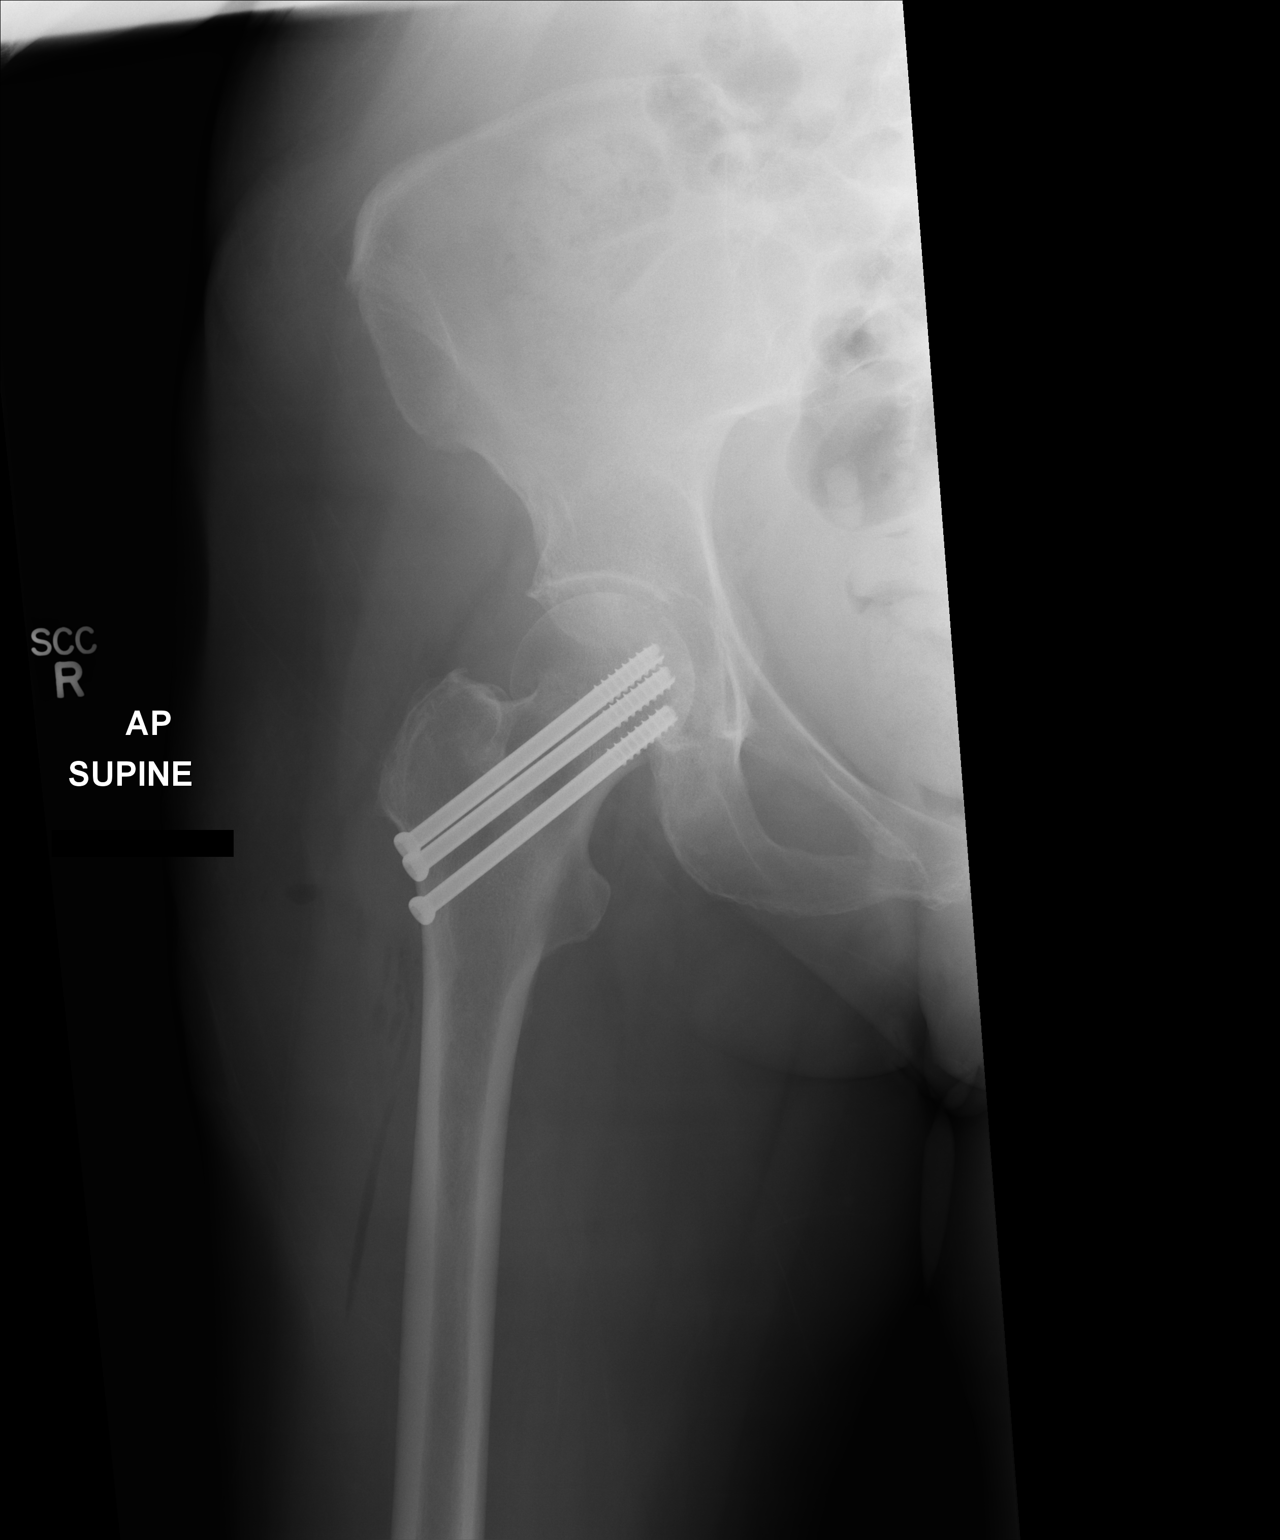

[lateral]
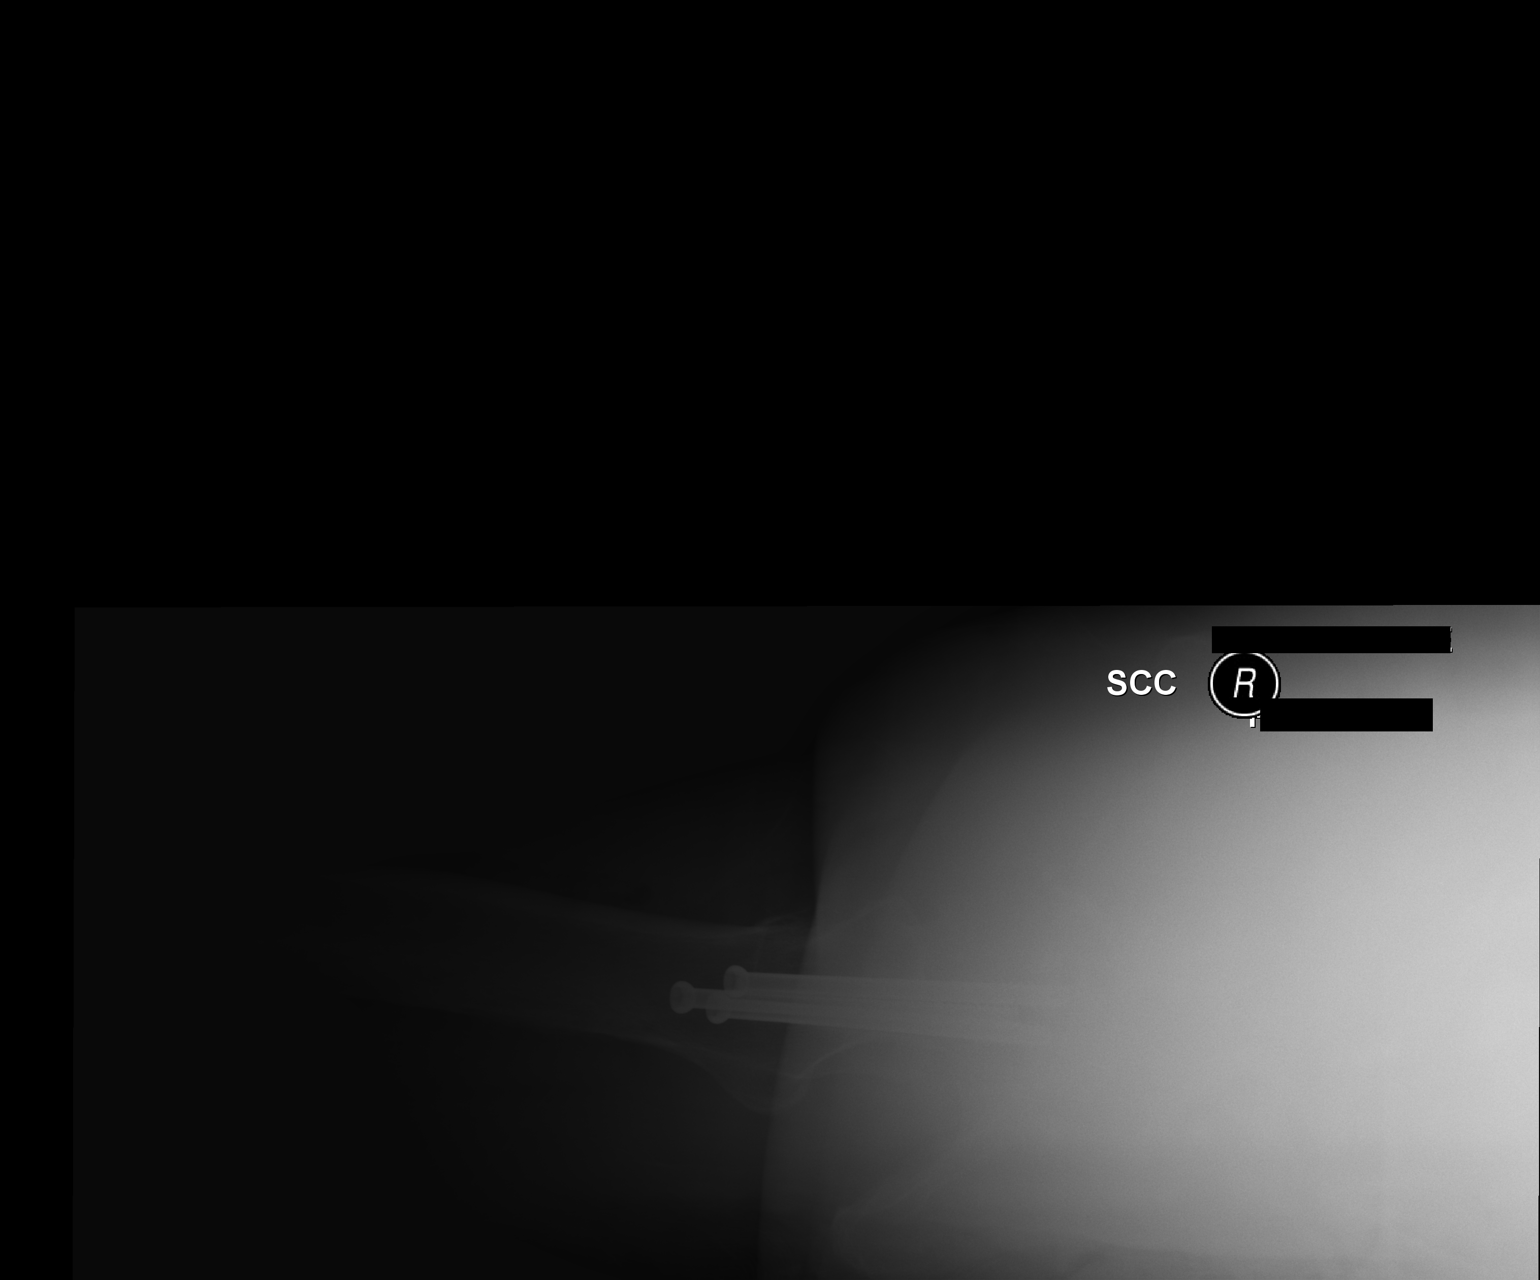

[3 of 3 positions shown; findings below may reference images not displayed]

FINDINGS: Status post ORIF of a subcapital right hip fracture with 3
cannulated cancellous screws.

Fracture fragments are in near anatomic alignment and position.

Visualized bony pelvis appears intact.
IMPRESSION: Status post ORIF of a right hip fracture, as above.

## 2015-10-04 IMAGING — CR DG PORTABLE PELVIS
1 series · 1 of 1 positions shown · non-contrast
Comparison: Intraoperative films from the same day.

CLINICAL DATA: Status post ORIF.  Right hip fracture.

EXAM:
PORTABLE PELVIS 1-2 VIEWS

[AP]
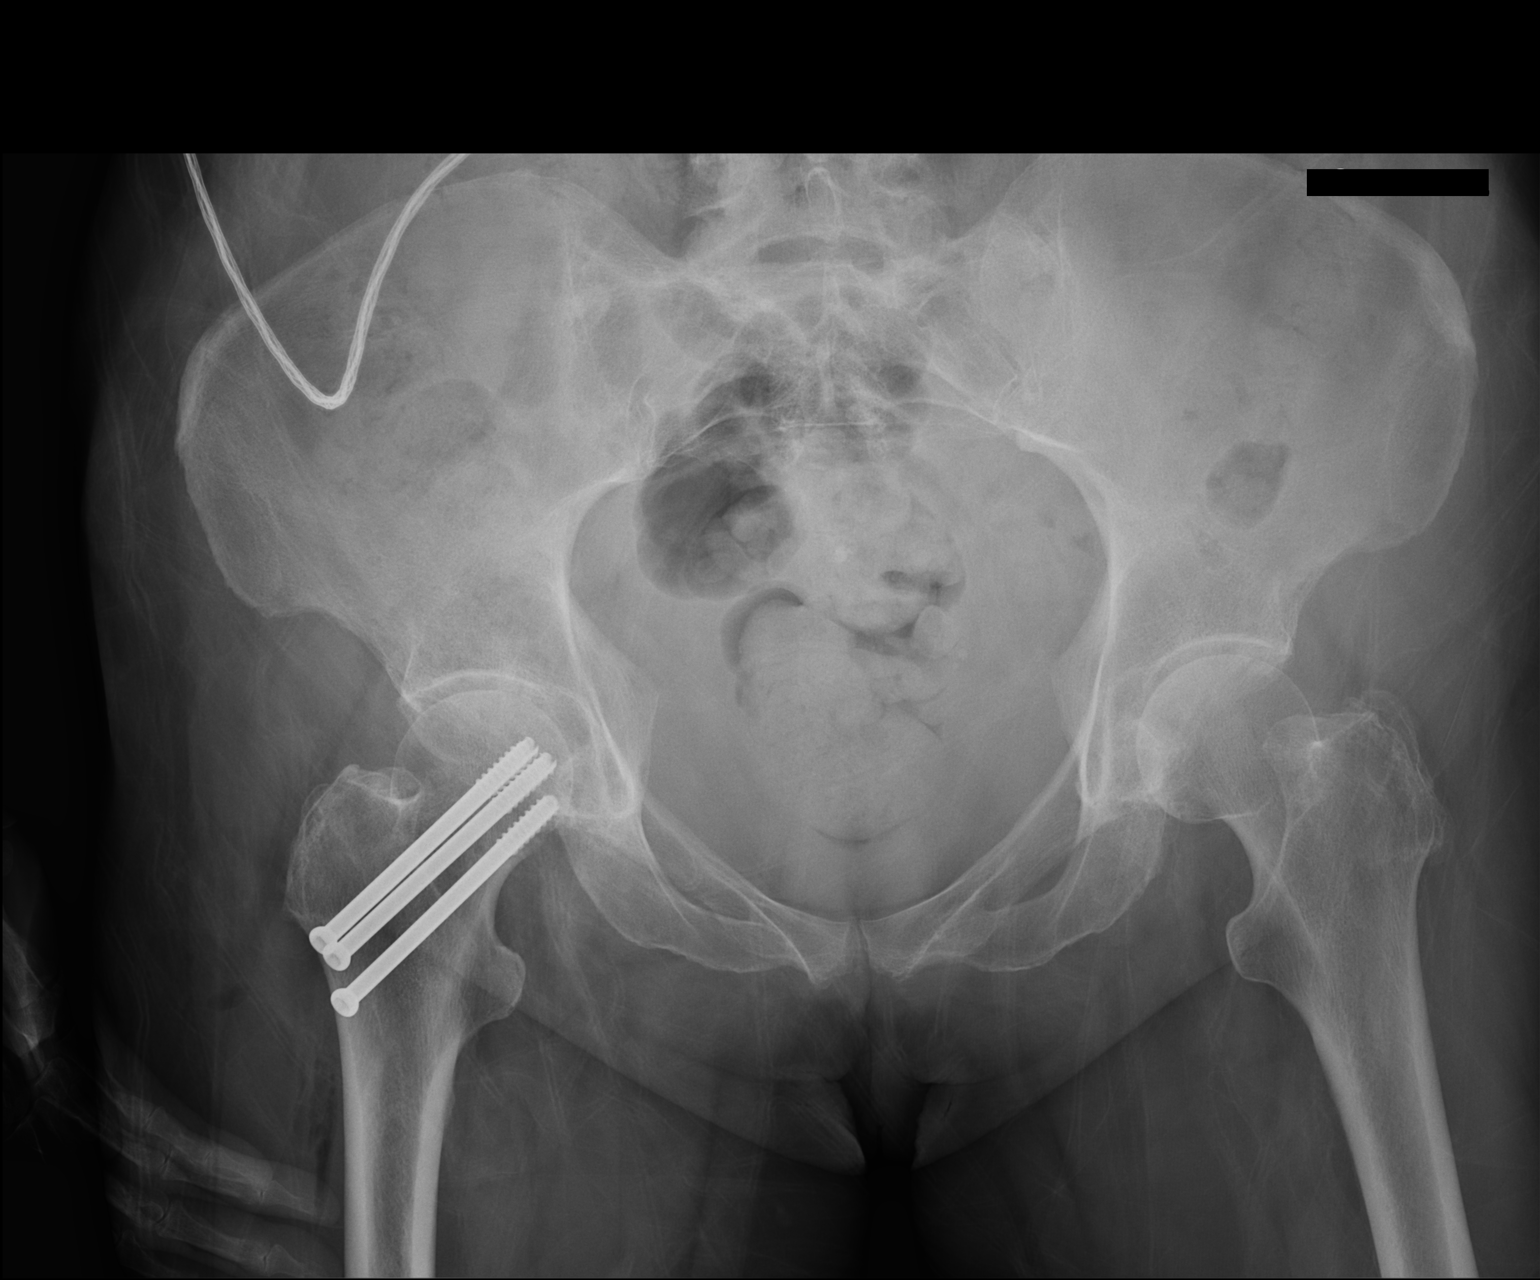

[1 of 1 positions shown; findings below may reference images not displayed]

FINDINGS: Three cannulated screws are in place. The screws cross the fracture.
The pelvis is unremarkable. Degenerative changes are noted in the
lower lumbar spine.
IMPRESSION: Status post ORIF of the right hip fracture without radiographic
evidence for complication.

## 2015-10-04 IMAGING — RF DG HIP OPERATIVE*R*
1 series · 4 of 4 positions shown · non-contrast
Comparison: CT right hip from the same day.

CLINICAL DATA: Right hip fracture.

EXAM:
DG OPERATIVE RIGHT HIP
TECHNIQUE: A single spot fluoroscopic AP image of the right hip is submitted.

[Series 1: run · 4 of 4 slices shown]
[im 1/4]
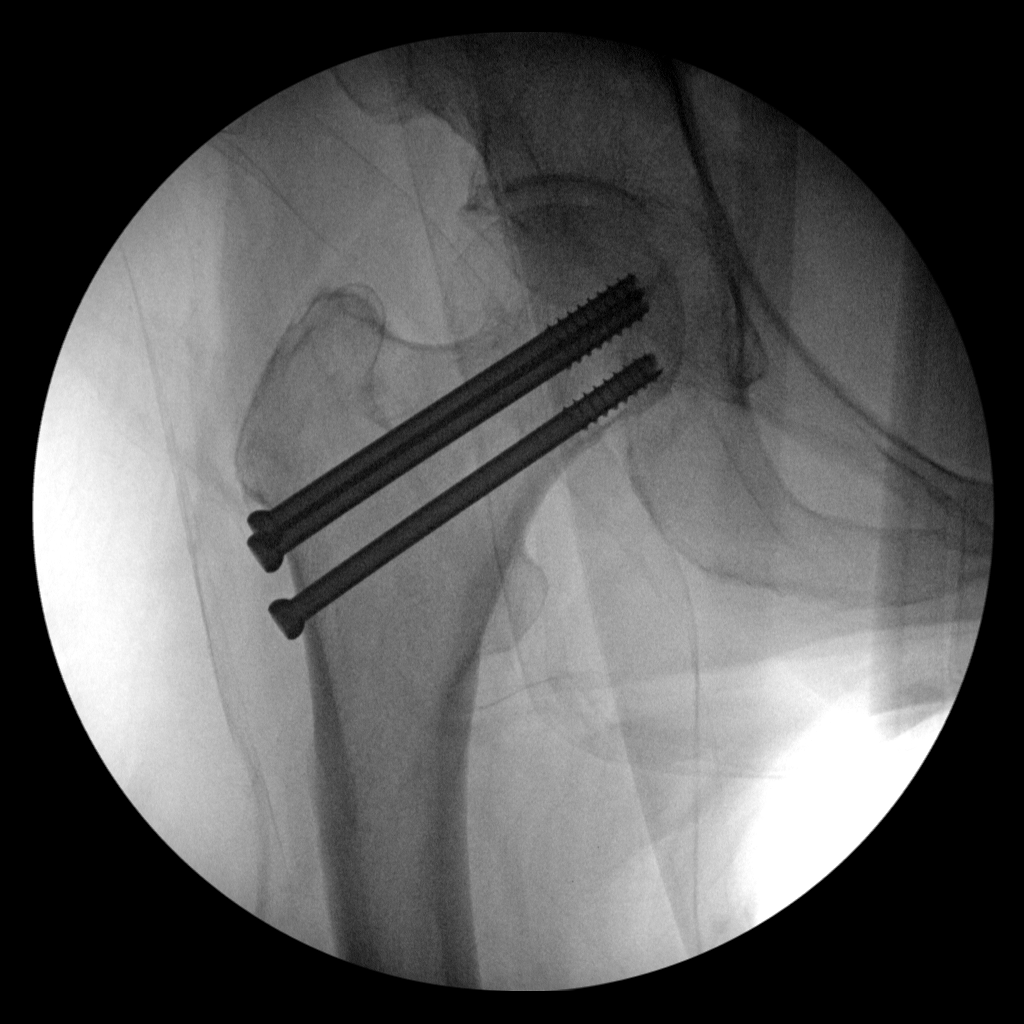
[im 2/4]
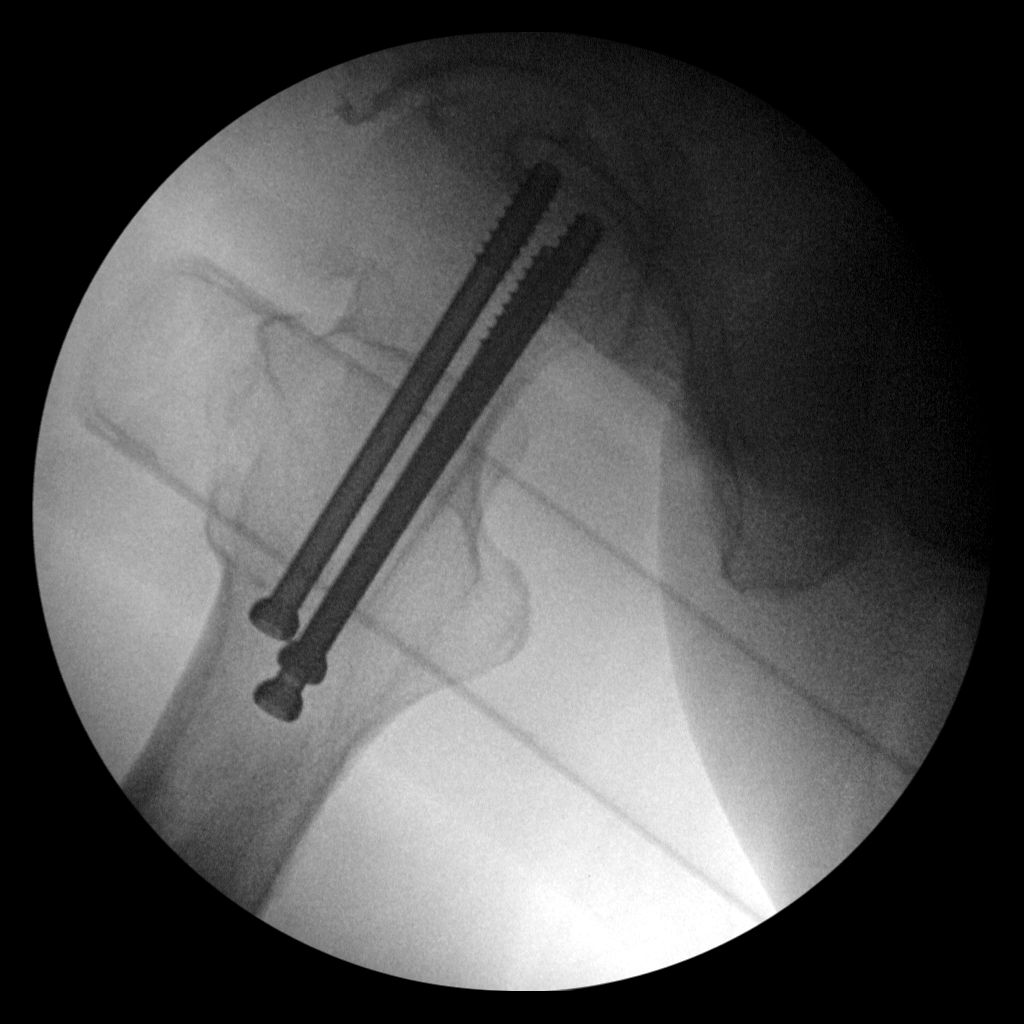
[im 3/4]
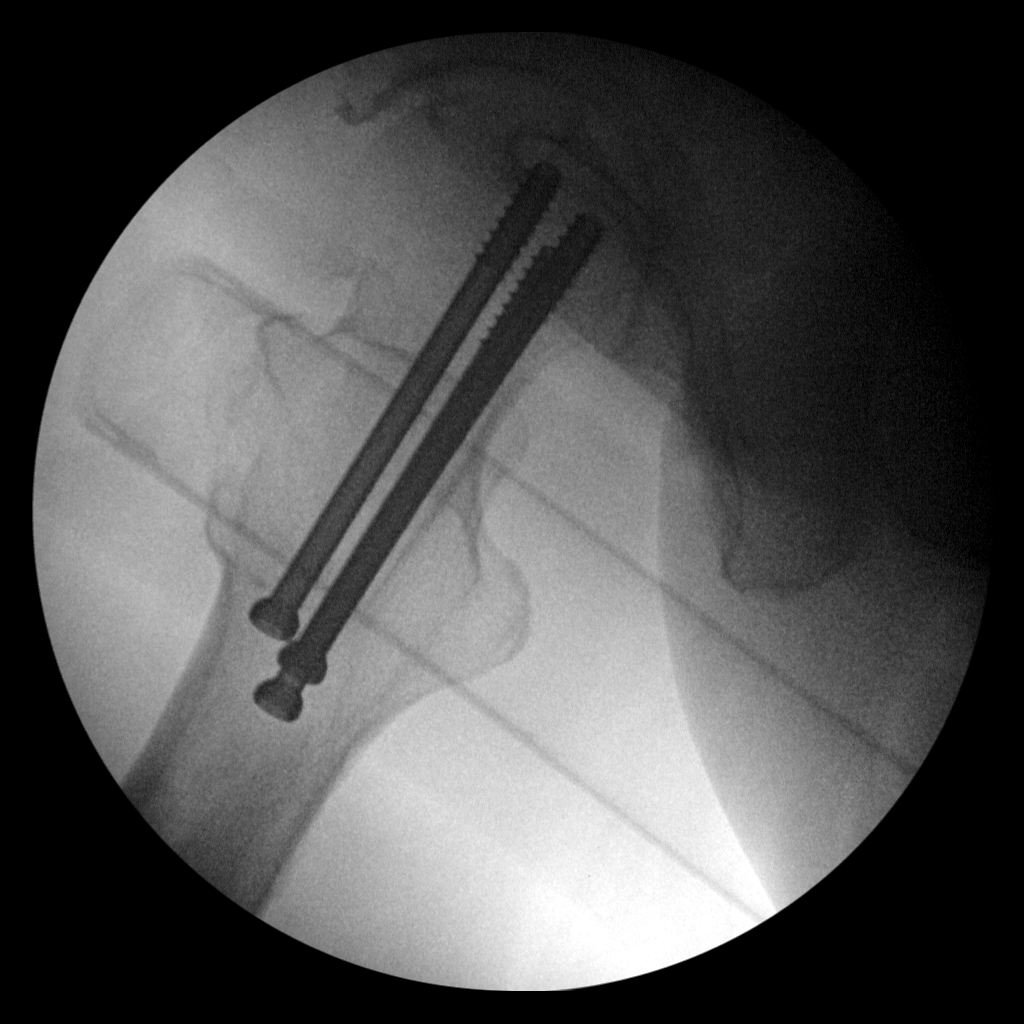
[im 4/4]
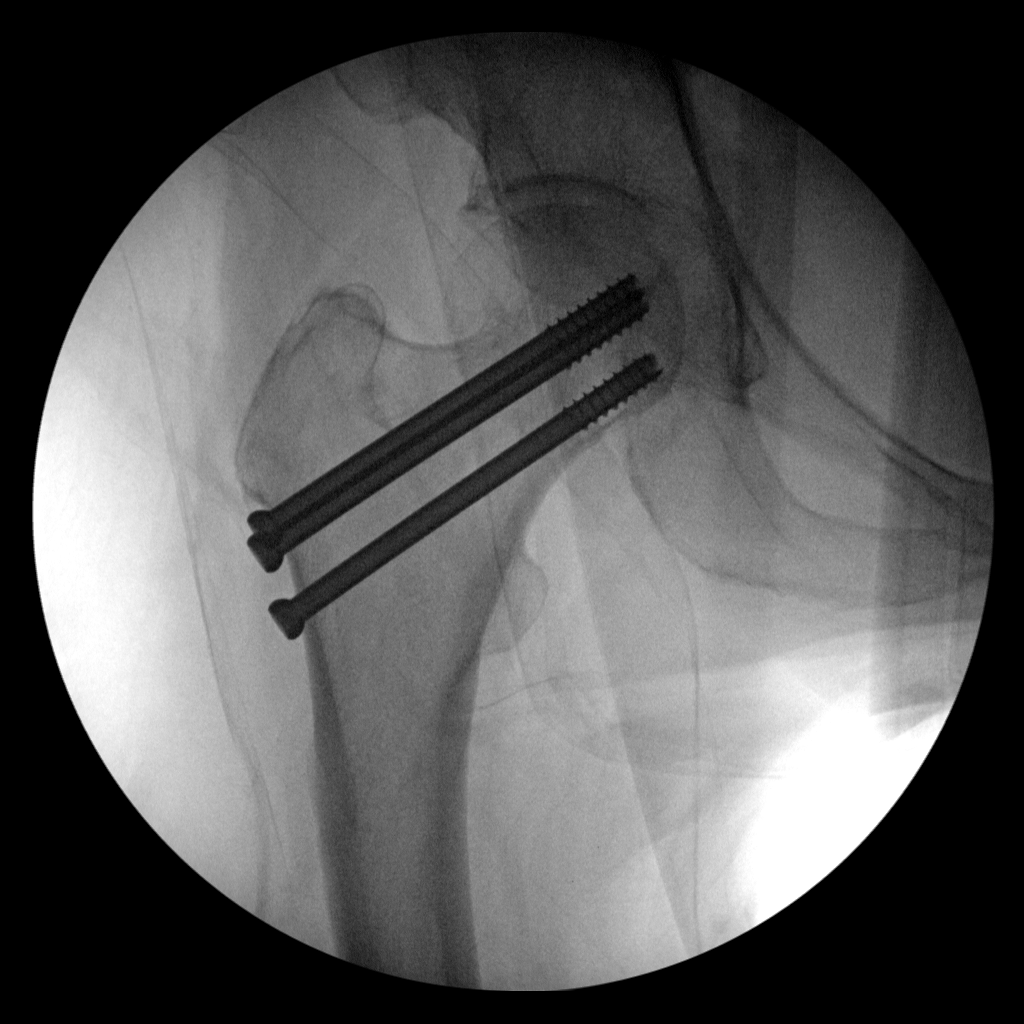

[4 of 4 positions shown; findings below may reference images not displayed]

FINDINGS: A single intraoperative film demonstrates 3 cannulated screws across
the subcapital femoral neck fracture. The hip appears located.
IMPRESSION: Status post ORIF of the right subcapital femoral neck fracture
without radiographic evidence for complication.
